# Patient Record
Sex: Male | Born: 1960 | Race: Black or African American | Hispanic: No | Marital: Single | Smoking: Never smoker
Health system: Southern US, Community
[De-identification: ages and names within clinical notes are randomized; demographics above are authoritative.]

## PROBLEM LIST (undated history)

## (undated) DIAGNOSIS — G4700 Insomnia, unspecified: Secondary | ICD-10-CM

## (undated) DIAGNOSIS — N529 Male erectile dysfunction, unspecified: Secondary | ICD-10-CM

## (undated) DIAGNOSIS — J302 Other seasonal allergic rhinitis: Secondary | ICD-10-CM

## (undated) DIAGNOSIS — M179 Osteoarthritis of knee, unspecified: Secondary | ICD-10-CM

## (undated) DIAGNOSIS — M1710 Unilateral primary osteoarthritis, unspecified knee: Secondary | ICD-10-CM

## (undated) DIAGNOSIS — M1990 Unspecified osteoarthritis, unspecified site: Secondary | ICD-10-CM

## (undated) HISTORY — DX: Unilateral primary osteoarthritis, unspecified knee: M17.10

## (undated) HISTORY — DX: Other seasonal allergic rhinitis: J30.2

## (undated) HISTORY — PX: SHOULDER SURGERY: SHX246

## (undated) HISTORY — DX: Unspecified osteoarthritis, unspecified site: M19.90

## (undated) HISTORY — DX: Insomnia, unspecified: G47.00

## (undated) HISTORY — DX: Male erectile dysfunction, unspecified: N52.9

## (undated) HISTORY — DX: Osteoarthritis of knee, unspecified: M17.9

## (undated) HISTORY — PX: ROTATOR CUFF REPAIR: SHX139

## (undated) HISTORY — PX: KNEE SURGERY: SHX244

## (undated) HISTORY — PX: UMBILICAL HERNIA REPAIR: SHX196

---

## 1999-05-06 ENCOUNTER — Inpatient Hospital Stay (HOSPITAL_COMMUNITY): Admission: AD | Admit: 1999-05-06 | Discharge: 1999-05-10 | Payer: Self-pay | Admitting: Internal Medicine

## 1999-05-07 ENCOUNTER — Encounter: Payer: Self-pay | Admitting: Internal Medicine

## 1999-07-14 ENCOUNTER — Ambulatory Visit (HOSPITAL_COMMUNITY): Admission: RE | Admit: 1999-07-14 | Discharge: 1999-07-14 | Payer: Self-pay | Admitting: Family Medicine

## 2001-02-25 ENCOUNTER — Encounter: Admission: RE | Admit: 2001-02-25 | Discharge: 2001-02-25 | Payer: Self-pay | Admitting: Family Medicine

## 2001-02-25 ENCOUNTER — Encounter: Payer: Self-pay | Admitting: Family Medicine

## 2001-04-16 ENCOUNTER — Ambulatory Visit (HOSPITAL_BASED_OUTPATIENT_CLINIC_OR_DEPARTMENT_OTHER): Admission: RE | Admit: 2001-04-16 | Discharge: 2001-04-16 | Payer: Self-pay | Admitting: Orthopedic Surgery

## 2002-06-04 ENCOUNTER — Encounter: Admission: RE | Admit: 2002-06-04 | Discharge: 2002-06-04 | Payer: Self-pay | Admitting: Orthopaedic Surgery

## 2002-06-04 ENCOUNTER — Encounter: Payer: Self-pay | Admitting: Orthopaedic Surgery

## 2002-06-18 ENCOUNTER — Ambulatory Visit (HOSPITAL_BASED_OUTPATIENT_CLINIC_OR_DEPARTMENT_OTHER): Admission: RE | Admit: 2002-06-18 | Discharge: 2002-06-18 | Payer: Self-pay | Admitting: Orthopaedic Surgery

## 2002-11-27 ENCOUNTER — Encounter: Payer: Self-pay | Admitting: Family Medicine

## 2002-11-27 ENCOUNTER — Encounter: Admission: RE | Admit: 2002-11-27 | Discharge: 2002-11-27 | Payer: Self-pay | Admitting: Family Medicine

## 2003-01-07 ENCOUNTER — Encounter: Payer: Self-pay | Admitting: Family Medicine

## 2003-01-07 ENCOUNTER — Encounter: Admission: RE | Admit: 2003-01-07 | Discharge: 2003-01-07 | Payer: Self-pay | Admitting: Family Medicine

## 2003-03-11 ENCOUNTER — Ambulatory Visit (HOSPITAL_BASED_OUTPATIENT_CLINIC_OR_DEPARTMENT_OTHER): Admission: RE | Admit: 2003-03-11 | Discharge: 2003-03-11 | Payer: Self-pay | Admitting: Orthopaedic Surgery

## 2003-04-24 ENCOUNTER — Encounter: Admission: RE | Admit: 2003-04-24 | Discharge: 2003-04-24 | Payer: Self-pay | Admitting: Family Medicine

## 2003-07-07 ENCOUNTER — Ambulatory Visit (HOSPITAL_COMMUNITY): Admission: RE | Admit: 2003-07-07 | Discharge: 2003-07-07 | Payer: Self-pay | Admitting: Orthopedic Surgery

## 2006-07-20 ENCOUNTER — Ambulatory Visit: Payer: Self-pay | Admitting: Family Medicine

## 2006-07-20 LAB — CONVERTED CEMR LAB
Cholesterol: 139 mg/dL (ref 0–200)
GC Probe Amp, Urine: NEGATIVE
Glucose, Bld: 106 mg/dL — ABNORMAL HIGH (ref 70–99)
HCV Ab: NEGATIVE
Hep B S Ab: NEGATIVE
Total CHOL/HDL Ratio: 4.3
Triglycerides: 125 mg/dL (ref 0–149)
VLDL: 25 mg/dL (ref 0–40)

## 2006-08-07 ENCOUNTER — Ambulatory Visit (HOSPITAL_COMMUNITY): Admission: RE | Admit: 2006-08-07 | Discharge: 2006-08-07 | Payer: Self-pay | Admitting: Orthopedic Surgery

## 2007-04-20 ENCOUNTER — Telehealth (INDEPENDENT_AMBULATORY_CARE_PROVIDER_SITE_OTHER): Payer: Self-pay | Admitting: *Deleted

## 2007-06-22 ENCOUNTER — Ambulatory Visit: Payer: Self-pay | Admitting: Family Medicine

## 2007-06-22 DIAGNOSIS — B009 Herpesviral infection, unspecified: Secondary | ICD-10-CM

## 2007-06-22 DIAGNOSIS — J019 Acute sinusitis, unspecified: Secondary | ICD-10-CM

## 2007-06-25 ENCOUNTER — Telehealth (INDEPENDENT_AMBULATORY_CARE_PROVIDER_SITE_OTHER): Payer: Self-pay | Admitting: Family Medicine

## 2007-10-15 ENCOUNTER — Ambulatory Visit: Payer: Self-pay | Admitting: Internal Medicine

## 2007-10-15 DIAGNOSIS — J069 Acute upper respiratory infection, unspecified: Secondary | ICD-10-CM | POA: Insufficient documentation

## 2007-10-16 ENCOUNTER — Ambulatory Visit: Payer: Self-pay | Admitting: Internal Medicine

## 2007-10-16 ENCOUNTER — Telehealth: Payer: Self-pay | Admitting: Internal Medicine

## 2007-10-16 DIAGNOSIS — R042 Hemoptysis: Secondary | ICD-10-CM | POA: Insufficient documentation

## 2007-10-18 ENCOUNTER — Encounter (INDEPENDENT_AMBULATORY_CARE_PROVIDER_SITE_OTHER): Payer: Self-pay | Admitting: *Deleted

## 2007-10-23 ENCOUNTER — Telehealth (INDEPENDENT_AMBULATORY_CARE_PROVIDER_SITE_OTHER): Payer: Self-pay | Admitting: *Deleted

## 2008-01-15 ENCOUNTER — Ambulatory Visit: Payer: Self-pay | Admitting: Internal Medicine

## 2008-01-15 LAB — CONVERTED CEMR LAB
ALT: 16 units/L (ref 0–53)
AST: 16 units/L (ref 0–37)
Albumin: 3.3 g/dL — ABNORMAL LOW (ref 3.5–5.2)
BUN: 13 mg/dL (ref 6–23)
Basophils Relative: 0.4 % (ref 0.0–3.0)
CO2: 32 meq/L (ref 19–32)
Chloride: 105 meq/L (ref 96–112)
Cholesterol: 147 mg/dL (ref 0–200)
Creatinine, Ser: 1.2 mg/dL (ref 0.4–1.5)
Eosinophils Absolute: 0.2 10*3/uL (ref 0.0–0.7)
Eosinophils Relative: 1.8 % (ref 0.0–5.0)
GFR calc Af Amer: 83 mL/min
Glucose, Bld: 87 mg/dL (ref 70–99)
LDL Cholesterol: 93 mg/dL (ref 0–99)
Lymphocytes Relative: 28.2 % (ref 12.0–46.0)
MCHC: 34 g/dL (ref 30.0–36.0)
Monocytes Relative: 10.9 % (ref 3.0–12.0)
Neutrophils Relative %: 58.7 % (ref 43.0–77.0)
PSA: 0.52 ng/mL (ref 0.10–4.00)
Platelets: 226 10*3/uL (ref 150–400)
Potassium: 4.3 meq/L (ref 3.5–5.1)
RDW: 13.9 % (ref 11.5–14.6)
TSH: 1.08 microintl units/mL (ref 0.35–5.50)
Total Bilirubin: 0.6 mg/dL (ref 0.3–1.2)
Total CHOL/HDL Ratio: 5.1
Total Protein, Urine: NEGATIVE mg/dL
Total Protein: 6.9 g/dL (ref 6.0–8.3)
Triglycerides: 125 mg/dL (ref 0–149)
Urine Glucose: NEGATIVE mg/dL
Urobilinogen, UA: 1 (ref 0.0–1.0)
pH: 6.5 (ref 5.0–8.0)

## 2008-01-18 ENCOUNTER — Ambulatory Visit: Payer: Self-pay | Admitting: Internal Medicine

## 2008-01-18 DIAGNOSIS — M1710 Unilateral primary osteoarthritis, unspecified knee: Secondary | ICD-10-CM

## 2008-01-22 ENCOUNTER — Telehealth (INDEPENDENT_AMBULATORY_CARE_PROVIDER_SITE_OTHER): Payer: Self-pay | Admitting: *Deleted

## 2008-05-12 ENCOUNTER — Ambulatory Visit: Payer: Self-pay | Admitting: Internal Medicine

## 2008-05-12 ENCOUNTER — Ambulatory Visit: Payer: Self-pay | Admitting: Cardiology

## 2008-05-12 DIAGNOSIS — F528 Other sexual dysfunction not due to a substance or known physiological condition: Secondary | ICD-10-CM

## 2008-05-12 DIAGNOSIS — R109 Unspecified abdominal pain: Secondary | ICD-10-CM

## 2008-05-12 LAB — CONVERTED CEMR LAB
ALT: 22 units/L (ref 0–53)
Alkaline Phosphatase: 48 units/L (ref 39–117)
Bilirubin, Direct: 0.2 mg/dL (ref 0.0–0.3)
CO2: 30 meq/L (ref 19–32)
Calcium: 8.9 mg/dL (ref 8.4–10.5)
Eosinophils Relative: 0.8 % (ref 0.0–5.0)
GFR calc Af Amer: 92 mL/min
Hemoglobin, Urine: NEGATIVE
Lymphocytes Relative: 20.5 % (ref 12.0–46.0)
Monocytes Absolute: 0.7 10*3/uL (ref 0.1–1.0)
Monocytes Relative: 6.7 % (ref 3.0–12.0)
Neutrophils Relative %: 71.5 % (ref 43.0–77.0)
Nitrite: NEGATIVE
Platelets: 211 10*3/uL (ref 150–400)
Potassium: 4.2 meq/L (ref 3.5–5.1)
RBC / HPF: NONE SEEN
RDW: 13.7 % (ref 11.5–14.6)
Sodium: 139 meq/L (ref 135–145)
Specific Gravity, Urine: >=1.03 (ref 1.000–1.03)
Total Bilirubin: 0.7 mg/dL (ref 0.3–1.2)
Total Protein, Urine: NEGATIVE mg/dL
Urine Glucose: NEGATIVE mg/dL
WBC: 10.5 10*3/uL (ref 4.5–10.5)
pH: 5.5 (ref 5.0–8.0)

## 2008-05-13 ENCOUNTER — Encounter: Payer: Self-pay | Admitting: Internal Medicine

## 2008-05-13 ENCOUNTER — Telehealth (INDEPENDENT_AMBULATORY_CARE_PROVIDER_SITE_OTHER): Payer: Self-pay | Admitting: *Deleted

## 2008-07-23 ENCOUNTER — Ambulatory Visit: Payer: Self-pay | Admitting: Internal Medicine

## 2008-07-23 DIAGNOSIS — N453 Epididymo-orchitis: Secondary | ICD-10-CM | POA: Insufficient documentation

## 2008-07-23 DIAGNOSIS — J309 Allergic rhinitis, unspecified: Secondary | ICD-10-CM | POA: Insufficient documentation

## 2008-07-23 DIAGNOSIS — G4710 Hypersomnia, unspecified: Secondary | ICD-10-CM | POA: Insufficient documentation

## 2008-07-24 ENCOUNTER — Encounter (INDEPENDENT_AMBULATORY_CARE_PROVIDER_SITE_OTHER): Payer: Self-pay | Admitting: *Deleted

## 2009-04-27 ENCOUNTER — Encounter: Payer: Self-pay | Admitting: Pulmonary Disease

## 2009-08-04 ENCOUNTER — Ambulatory Visit: Payer: Self-pay | Admitting: Internal Medicine

## 2009-08-04 DIAGNOSIS — R21 Rash and other nonspecific skin eruption: Secondary | ICD-10-CM

## 2009-08-04 LAB — CONVERTED CEMR LAB
ALT: 19 units/L (ref 0–53)
BUN: 11 mg/dL (ref 6–23)
Basophils Absolute: 0 10*3/uL (ref 0.0–0.1)
Bilirubin Urine: NEGATIVE
Chloride: 107 meq/L (ref 96–112)
Cholesterol: 149 mg/dL (ref 0–200)
Eosinophils Absolute: 0.2 10*3/uL (ref 0.0–0.7)
Glucose, Bld: 99 mg/dL (ref 70–99)
HCT: 42.2 % (ref 39.0–52.0)
LDL Cholesterol: 99 mg/dL (ref 0–99)
Lymphs Abs: 1.8 10*3/uL (ref 0.7–4.0)
MCHC: 33.4 g/dL (ref 30.0–36.0)
MCV: 84.7 fL (ref 78.0–100.0)
Monocytes Absolute: 0.9 10*3/uL (ref 0.1–1.0)
Nitrite: NEGATIVE
Platelets: 218 10*3/uL (ref 150.0–400.0)
Potassium: 4.3 meq/L (ref 3.5–5.1)
RDW: 14.7 % — ABNORMAL HIGH (ref 11.5–14.6)
Total Bilirubin: 0.8 mg/dL (ref 0.3–1.2)
Total Protein, Urine: NEGATIVE mg/dL
Triglycerides: 97 mg/dL (ref 0.0–149.0)
VLDL: 19.4 mg/dL (ref 0.0–40.0)
pH: 5.5 (ref 5.0–8.0)

## 2009-09-09 ENCOUNTER — Ambulatory Visit: Payer: Self-pay | Admitting: Internal Medicine

## 2009-09-09 DIAGNOSIS — G4700 Insomnia, unspecified: Secondary | ICD-10-CM

## 2009-09-11 LAB — CONVERTED CEMR LAB

## 2009-10-20 ENCOUNTER — Encounter: Payer: Self-pay | Admitting: Internal Medicine

## 2010-04-29 ENCOUNTER — Telehealth: Payer: Self-pay | Admitting: Endocrinology

## 2010-05-11 NOTE — Assessment & Plan Note (Signed)
Summary: CHEST/SPOT---STC   Vital Signs:  Patient profile:   50 year old male Height:      72 inches Weight:      389 pounds BMI:     52.95 O2 Sat:      96 % on Room air Temp:     98.2 degrees F oral Pulse rate:   71 / minute BP sitting:   134 / 92  (left arm) Cuff size:   large  Vitals Entered ByMarland Kitchen Zella Ball Ewing (August 04, 2009 11:37 AM)  O2 Flow:  Room air  CC: Spots on Chest/RE   CC:  Spots on Chest/RE.  History of Present Illness: works for medical supply company;  wants to try viagra instead the lower dose levitra;  also with skin lesions multiple to the chest , upper back, right leg and left chest;  has some blood with teeth brushing but overall minor per pt and may be more due to gingival disease;  Pt denies CP, sob, doe, wheezing, orthopnea, pnd, worsening LE edema, palps, dizziness or syncope  Pt denies new neuro symptoms such as headache, facial or extremity weakness  Denies polys and OSA symtpoms such as hypersomnolence.   No trauma to skin, no itch and no other spont blood loss such as urine or rectal.    Problems Prior to Update: 1)  Rash-nonvesicular  (ICD-782.1) 2)  Unspecified Orchitis and Epididymitis  (ICD-604.90) 3)  Hypersomnia  (ICD-780.54) 4)  Allergic Rhinitis  (ICD-477.9) 5)  Erectile Dysfunction  (ICD-302.72) 6)  Flank Pain, Right  (ICD-789.09) 7)  Osteoarthritis, Knees, Bilateral  (ICD-715.96) 8)  Sexually Transmitted Disease, Exposure To  (ICD-V01.6) 9)  Preventive Health Care  (ICD-V70.0) 10)  Hemoptysis  (ICD-786.3) 11)  Uri  (ICD-465.9) 12)  Sinusitis- Acute-nos  (ICD-461.9) 13)  Hsv  (ICD-054.9)  Medications Prior to Update: 1)  Levitra 10 Mg  Tabs (Vardenafil Hcl) .... Take One Table As Needed 2)  Acyclovir 200 Mg  Caps (Acyclovir) .... Take One Tablet Daily 3)  Amoxicillin-Pot Clavulanate 875-125 Mg  Tabs (Amoxicillin-Pot Clavulanate) .Marland Kitchen.. 1 Q 12 Hrs With A Meal 4)  Promethazine-Codeine 6.25-10 Mg/79ml  Syrp (Promethazine-Codeine) .Marland Kitchen.. 1 Tsp  Q 6 Hrs As Needed Cough 5)  Prednisone 20 Mg  Tabs (Prednisone) .Marland Kitchen.. 1 Two Times A Day With Food 6)  Acyclovir 200 Mg Caps (Acyclovir) .Marland Kitchen.. 1 By Mouth Once Daily 7)  Hydrocodone-Acetaminophen 5-325 Mg Tabs (Hydrocodone-Acetaminophen) .Marland Kitchen.. 1 - 2 By Mouth Q 6 Hrs As Needed Pain 8)  Fluticasone Propionate 50 Mcg/act Susp (Fluticasone Propionate) .... 2 Spray/side Once Daily As Needed 9)  Cetirizine Hcl 10 Mg Tabs (Cetirizine Hcl) .Marland Kitchen.. 1 By Mouth Once Daily As Needed 10)  Ciprofloxacin Hcl 500 Mg Tabs (Ciprofloxacin Hcl) .Marland Kitchen.. 1 By Mouth Two Times A Day  Current Medications (verified): 1)  Viagra 100 Mg Tabs (Sildenafil Citrate) .Marland Kitchen.. 1 By Mouth Once Daily As Needed 2)  Acyclovir 200 Mg Caps (Acyclovir) .Marland Kitchen.. 1 By Mouth Once Daily  Allergies (verified): No Known Drug Allergies  Past History:  Past Medical History: Last updated: 07/23/2008 knee djd right thumb djd genital herpes E.D. Allergic rhinitis  Past Surgical History: Last updated: 01/18/2008 s/p bilat knee surgury x 5 total s/p right rotater cuff tear  s/p left shoulder tendon surgury s/p umbilical hernia repair as infant  Family History: Last updated: 01/18/2008 mother with arthritis, HTN, heart disease father died ? reason/undetermined at 73 yo several others with heart disease,stroke  Social History: Last updated: 01/18/2008 Divorced  2 children work Radiographer, therapeutic - [employer] Never Smoked Alcohol use-yes  Risk Factors: Smoking Status: never (01/18/2008)  Review of Systems  The patient denies anorexia, fever, weight loss, vision loss, decreased hearing, hoarseness, chest pain, syncope, peripheral edema, prolonged cough, headaches, hemoptysis, abdominal pain, melena, hematochezia, severe indigestion/heartburn, hematuria, muscle weakness, suspicious skin lesions, transient blindness, difficulty walking, depression, unusual weight change, abnormal bleeding, enlarged lymph nodes, and angioedema.         all  otherwise negative per pt -    Physical Exam  General:  alert and overweight-appearing.   Head:  normocephalic and atraumatic.   Eyes:  vision grossly intact, pupils equal, and pupils round.   Ears:  R ear normal and L ear normal.   Nose:  no external deformity and no nasal discharge.   Mouth:  no gingival abnormalities and pharynx pink and moist.   Neck:  supple and no masses.   Lungs:  normal respiratory effort and normal breath sounds.   Heart:  normal rate and regular rhythm.   Abdomen:  soft, non-tender, and normal bowel sounds.   Msk:  no joint tenderness and no joint swelling.   Extremities:  trace edema, no erythema  Neurologic:  cranial nerves II-XII intact and strength normal in all extremities.   Skin:  has plaque-like skin lesions up to 3 cm without tenderness - left upper chest, right upper back adn mid upper back;  right lateral thigh and left lat chest Psych:  not anxious appearing and not depressed appearing.     Impression & Recommendations:  Problem # 1:  Preventive Health Care (ICD-V70.0)  Overall doing well, age appropriate education and counseling updated and referral for appropriate preventive services done unless declined, immunizations up to date or declined, diet counseling done if overweight, urged to quit smoking if smokes , most recent labs reviewed and current ordered if appropriate, ecg reviewed or declined (interpretation per ECG scanned in the EMR if done); information regarding Medicare Prevention requirements given if appropriate   Orders: TLB-BMP (Basic Metabolic Panel-BMET) (80048-METABOL) TLB-CBC Platelet - w/Differential (85025-CBCD) TLB-Hepatic/Liver Function Pnl (80076-HEPATIC) TLB-Lipid Panel (80061-LIPID) TLB-PSA (Prostate Specific Antigen) (84153-PSA) TLB-TSH (Thyroid Stimulating Hormone) (84443-TSH) TLB-Udip ONLY (81003-UDIP)  Problem # 2:  ERECTILE DYSFUNCTION (ICD-302.72)  His updated medication list for this problem includes:     Viagra 100 Mg Tabs (Sildenafil citrate) .Marland Kitchen... 1 by mouth once daily as needed treat as above, f/u any worsening signs or symptoms   Problem # 3:  RASH-NONVESICULAR (ICD-782.1) unclear etiology - appearance somewhat bruise like but nontener and persists for 3 mo - to refer to derm  Orders: Dermatology Referral (Derma)  Complete Medication List: 1)  Viagra 100 Mg Tabs (Sildenafil citrate) .Marland Kitchen.. 1 by mouth once daily as needed 2)  Acyclovir 200 Mg Caps (Acyclovir) .Marland Kitchen.. 1 by mouth once daily  Patient Instructions: 1)  Please take all new medications as prescribed 2)  Continue all previous medications as before this visit  3)  Please go to the Lab in the basement for your blood and/or urine tests today 4)  You will be contacted about the referral(s) to: Dermatology 5)  Please schedule a follow-up appointment in 1 year or sooner if needed Prescriptions: VIAGRA 100 MG TABS (SILDENAFIL CITRATE) 1 by mouth once daily as needed  #5 x 11   Entered and Authorized by:   Corwin Levins MD   Signed by:   Corwin Levins MD on 08/04/2009   Method used:  Print then Give to Patient   RxID:   [id number]

## 2010-05-11 NOTE — Assessment & Plan Note (Signed)
Summary: PHYSICAL  -STC   Vital Signs:  Patient profile:   50 year old male Height:      72 inches Weight:      396.50 pounds BMI:     53.97 O2 Sat:      98 % on Room air Temp:     97.5 degrees F oral Pulse rate:   78 / minute BP sitting:   144 / 90  (left arm) Cuff size:   large  Vitals Entered ByZella Ball Ewing (September 09, 2009 9:42 AM)  O2 Flow:  Room air CC: Adult Physical/RE   CC:  Adult Physical/RE.  History of Present Illness: here to f/u; still with significant problem wtih getting to sleep most nights but denies worsening depressive symtpoms  or panic.  Gained several bls recetnly, wtih increased daytime somnolence as well, snoring at night, non-restorative sleep.  BP mild elev today - no hx of essential HTN.  Has not been able to be seen per pulm yet.   also requests STD check today, has known hx of genital herpes.  Pt denies CP, sob, doe, wheezing, orthopnea, pnd, worsening LE edema, palps, dizziness or syncope   Pt denies new neuro symptoms such as headache, facial or extremity weakness   Preventive Screening-Counseling & Management      Drug Use:  no.    Problems Prior to Update: 1)  Elevated Blood Pressure Without Diagnosis of Hypertension  (ICD-796.2) 2)  Insomnia-sleep Disorder-unspec  (ICD-780.52) 3)  Sexually Transmitted Disease, Exposure To  (ICD-V01.6) 4)  Rash-nonvesicular  (ICD-782.1) 5)  Unspecified Orchitis and Epididymitis  (ICD-604.90) 6)  Hypersomnia  (ICD-780.54) 7)  Allergic Rhinitis  (ICD-477.9) 8)  Erectile Dysfunction  (ICD-302.72) 9)  Flank Pain, Right  (ICD-789.09) 10)  Osteoarthritis, Knees, Bilateral  (ICD-715.96) 11)  Sexually Transmitted Disease, Exposure To  (ICD-V01.6) 12)  Preventive Health Care  (ICD-V70.0) 13)  Hemoptysis  (ICD-786.3) 14)  Uri  (ICD-465.9) 15)  Sinusitis- Acute-nos  (ICD-461.9) 16)  Hsv  (ICD-054.9)  Medications Prior to Update: 1)  Viagra 100 Mg Tabs (Sildenafil Citrate) .Marland Kitchen.. 1 By Mouth Once Daily As Needed 2)   Acyclovir 200 Mg Caps (Acyclovir) .Marland Kitchen.. 1 By Mouth Once Daily  Current Medications (verified): 1)  Viagra 100 Mg Tabs (Sildenafil Citrate) .Marland Kitchen.. 1 By Mouth Once Daily As Needed 2)  Acyclovir 200 Mg Caps (Acyclovir) .Marland Kitchen.. 1 By Mouth Once Daily 3)  Zolpidem Tartrate 10 Mg Tabs (Zolpidem Tartrate) .Marland Kitchen.. 1po At Bedtime As Needed  Allergies (verified): No Known Drug Allergies  Past History:  Past Medical History: Last updated: 07/23/2008 knee djd right thumb djd genital herpes E.D. 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   Head:  normocephalic and atraumatic.   Eyes:  vision grossly intact, pupils equal, and pupils round.   Ears:  R ear normal and  L ear normal.   Nose:  no external deformity and no nasal discharge.   Mouth:  no gingival abnormalities and pharynx pink and moist.   Neck:  supple and no masses.   Lungs:  normal respiratory effort and normal breath sounds.   Heart:  normal rate and regular rhythm.   Abdomen:  soft, non-tender, and normal bowel sounds.   Genitalia:  deferred Msk:  no joint warmth.   Extremities:  no edema, no erythema    Impression & Recommendations:  Problem # 1:  HYPERSOMNIA (ICD-780.54) refer to pulmonary Orders: EKG w/ Interpretation (93000) Pulmonary Referral  (Pulmonary)  Problem # 2:  INSOMNIA-SLEEP DISORDER-UNSPEC (ICD-780.52)  His updated medication list for this problem includes:    Zolpidem Tartrate 10 Mg Tabs (Zolpidem tartrate) .Marland Kitchen... 1po at bedtime as needed treat as above, f/u any worsening signs or symptoms   Problem # 3:  SEXUALLY TRANSMITTED DISEASE, EXPOSURE TO (ICD-V01.6)  to check STD related testing today per pt request; has known hx of genital herpes  Orders: T-HIV-1 (Screen) (16109) T-Syphilis Test ([facility]) [phone]) T-Chlamydia & GC Probe, Urine (87491/[id number])  Problem # 4:  ELEVATED BLOOD PRESSURE WITHOUT DIAGNOSIS OF HYPERTENSION (ICD-796.2) to follow for now, check BP at home  Complete Medication List: 1)  Viagra 100 Mg Tabs (Sildenafil citrate) .Marland Kitchen.. 1 by mouth once daily as needed 2)  Acyclovir 200 Mg Caps (Acyclovir) .Marland Kitchen.. 1 by mouth once daily 3)  Zolpidem Tartrate 10 Mg Tabs (Zolpidem tartrate) .Marland Kitchen.. 1po at bedtime as needed  Patient Instructions: 1)  Please take all new medications as prescribed 2)  Continue all previous medications as before this visit  3)  Please go to the Lab in the basement for your blood and/or urine tests today 4)  You will be contacted about the referral(s) to: Pulmonary 5)  Please see the dermatologist as planned 6)  Check your Blood Pressure regularly. If it is above 140/90: you should make an appointment. 7)  Please schedule a follow-up appointment in 1 year, or sooner if needed Prescriptions: ZOLPIDEM TARTRATE 10 MG TABS (ZOLPIDEM TARTRATE) 1po at bedtime as needed  #30 x 5   Entered and Authorized by:   Corwin Levins MD   Signed by:   Corwin Levins MD on 09/09/2009   Method used:   Print then Give to Patient   RxID:   [id number]

## 2010-05-11 NOTE — Miscellaneous (Signed)
Summary: Orders Update  Clinical Lists Changes  Orders: Added new Service order of No Show NS50 (NS50) - Signed 

## 2010-05-11 NOTE — Consult Note (Signed)
Summary: Dermatology Specialist  Dermatology Specialist   Imported By: Lennie Odor 10/22/2009 09:03:44  _____________________________________________________________________  External Attachment:    Type:   Image     Comment:   External Document

## 2010-05-13 ENCOUNTER — Ambulatory Visit (INDEPENDENT_AMBULATORY_CARE_PROVIDER_SITE_OTHER): Payer: BC Managed Care – PPO | Admitting: Internal Medicine

## 2010-05-13 ENCOUNTER — Encounter: Payer: Self-pay | Admitting: Internal Medicine

## 2010-05-13 DIAGNOSIS — J209 Acute bronchitis, unspecified: Secondary | ICD-10-CM

## 2010-05-13 DIAGNOSIS — M549 Dorsalgia, unspecified: Secondary | ICD-10-CM

## 2010-05-13 DIAGNOSIS — J309 Allergic rhinitis, unspecified: Secondary | ICD-10-CM

## 2010-05-13 DIAGNOSIS — G4710 Hypersomnia, unspecified: Secondary | ICD-10-CM

## 2010-05-13 NOTE — Progress Notes (Signed)
Summary: Ambien/JWJ pt  Phone Note Refill Request Message from:  Patient on April 29, 2010 4:20 PM  Refills Requested: Medication #1:  ZOLPIDEM TARTRATE 10 MG TABS 1po at bedtime as needed.   Dosage confirmed as above?Dosage Confirmed   Supply Requested: 3 months  Method Requested: Electronic Initial call taken by: Margaret Pyle, CMA,  April 29, 2010 4:20 PM  Follow-up for Phone Call        i printed Follow-up by: Minus Breeding MD,  May 02, 2010 4:16 PM  Additional Follow-up for Phone Call Additional follow up Details #1::        Rx telephoned to pharmacy as [facility]E is unavailable to [facility] Rx Additional Follow-up by: Margaret Pyle, CMA,  May 03, 2010 9:21 AM    Prescriptions: ZOLPIDEM TARTRATE 10 MG TABS (ZOLPIDEM TARTRATE) 1po at bedtime as needed  #30 x 0   Entered and Authorized by:   Margaret Pyle, CMA   Signed by:   Margaret Pyle, CMA on 05/03/2010   Method used:   Telephoned to ...       [facility] [address] [phone]* (retail)       [address]       [place], [place]  [address]       Ph: [phone]       Fax: [phone]   RxID:   [id number] ZOLPIDEM TARTRATE 10 MG TABS (ZOLPIDEM TARTRATE) 1po at bedtime as needed  #30 x 0   Entered and Authorized by:   Minus Breeding MD   Signed by:   Minus Breeding MD on 05/02/2010   Method used:   Print then Give to Patient   RxID:   [id number]

## 2010-05-19 NOTE — Assessment & Plan Note (Signed)
Summary: chest congestion   Vital Signs:  Patient profile:   50 year old male Height:      71 inches Weight:      339.25 pounds BMI:     47.49 O2 Sat:      97 % on Room air Temp:     99.2 degrees F oral Pulse rate:   76 / minute BP sitting:   122 / 78  (left arm) Cuff size:   small  Vitals Entered By: Zella Ball Ewing CMA (AAMA) (May 13, 2010 9:26 AM)  O2 Flow:  Room air CC: Chest Congestion/RE   CC:  Chest Congestion/RE.  History of Present Illness: here to f/u  - overall doing ok , and has been able to dramatically lose wt intentionally from 396 to current 339 with better diet since june 2011;  trying to be more active as well, but still with significant fatigue and daytime sleepiness , with AM headaches  and snoring at night despite the wt loss.  Also with 2-3 days onset fever, headache, ST and now prod cough with greenish sputum overall mild to mod.,  but Pt denies CP, worsening sob, doe, wheezing, orthopnea, pnd, worsening LE edema, palps, dizziness or syncope  Pt denies new neuro symptoms such as headache, facial or extremity weakness  Pt denies polydipsia, polyuria.  Does also have some right lower back pain with cough that aches for several hours in the past few days at a time, but no other back or side or chest pain,  no bowel or bladder change, no gait change, fall and no LE pain/numb/weak/   Has ongoing nasal allergy symptoms but usually in the spring only for the most part and have not yet started this yr.    Problems Prior to Update: 1)  Bronchitis-acute  (ICD-466.0) 2)  Insomnia-sleep Disorder-unspec  (ICD-780.52) 3)  Sexually Transmitted Disease, Exposure To  (ICD-V01.6) 4)  Rash-nonvesicular  (ICD-782.1) 5)  Unspecified Orchitis and Epididymitis  (ICD-604.90) 6)  Hypersomnia  (ICD-780.54) 7)  Allergic Rhinitis  (ICD-477.9) 8)  Erectile Dysfunction  (ICD-302.72) 9)  Flank Pain, Right  (ICD-789.09) 10)  Osteoarthritis, Knees, Bilateral  (ICD-715.96) 11)  Sexually  Transmitted Disease, Exposure To  (ICD-V01.6) 12)  Preventive Health Care  (ICD-V70.0) 13)  Hemoptysis  (ICD-786.3) 14)  Uri  (ICD-465.9) 15)  Sinusitis- Acute-nos  (ICD-461.9) 16)  Hsv  (ICD-054.9)  Medications Prior to Update: 1)  Viagra 100 Mg Tabs (Sildenafil Citrate) .Marland Kitchen.. 1 By Mouth Once Daily As Needed 2)  Acyclovir 200 Mg Caps (Acyclovir) .Marland Kitchen.. 1 By Mouth Once Daily 3)  Zolpidem Tartrate 10 Mg Tabs (Zolpidem Tartrate) .Marland Kitchen.. 1po At Bedtime As Needed  Current Medications (verified): 1)  Viagra 100 Mg Tabs (Sildenafil Citrate) .Marland Kitchen.. 1 By Mouth Once Daily As Needed 2)  Acyclovir 200 Mg Caps (Acyclovir) .Marland Kitchen.. 1 By Mouth Once Daily 3)  Zolpidem Tartrate 10 Mg Tabs (Zolpidem Tartrate) .Marland Kitchen.. 1po At Bedtime As Needed 4)  Cephalexin 500 Mg Caps (Cephalexin) .Marland Kitchen.. 1po Three Times A Day 5)  Tussionex Pennkinetic Er 10-8 Mg/73ml Lqcr (Hydrocod Polst-Chlorphen Polst) .Marland Kitchen.. 1 By Mouth Two Times A Day As Needed  Allergies (verified): No Known Drug Allergies  Past History:  Past Medical History: Last updated: 07/23/2008 knee djd right thumb djd genital herpes E.D. 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   Eyes:  vision grossly intact, pupils equal, and pupils round.   Ears:  bilat tm's mild erythema, sinus nontender Nose:  nasal dischargemucosal pallor and mucosal edema.   Mouth:  pharyngeal erythema and fair dentition.   Neck:  supple and cervical lymphadenopathy.    Lungs:  normal respiratory effort and normal breath sounds, no wheezes or rales.   Heart:  normal rate and regular rhythm.   Msk:  no joint tenderness and no joint swelling.  and no spine or paraspinal tender or swelling or rash Extremities:  no edema, no erythema  Neurologic:  strength normal in all extremities and gait normal.     Impression & Recommendations:  Problem # 1:  BRONCHITIS-ACUTE (ICD-466.0)  His updated medication list for this problem includes:    Cephalexin 500 Mg Caps (Cephalexin) .Marland Kitchen... 1po three times a day    Tussionex Pennkinetic Er 10-8 Mg/64ml Lqcr (Hydrocod polst-chlorphen polst) .Marland Kitchen... 1 by mouth two times a day as needed treat as above, f/u any worsening signs or symptoms   Problem # 2:  HYPERSOMNIA (ICD-780.54) symptoms persist despite recent wt loss , encouragae further wt loss, refer pulm for further eval and tx Orders: Pulmonary Referral (Pulmonary)  Problem # 3:  BACK PAIN (ICD-724.5) c/w MSK strain most likely related to cough  - exam benign, ok to follow with expectant management for further symtpoms and signs, and for tylenol as needed   Problem # 4:  ALLERGIC RHINITIS (ICD-477.9) only seasonal it seems - stable overall by hx and exam, ok to continue meds/tx as is - for allegra as needed   Complete Medication List: 1)  Viagra 100 Mg Tabs (Sildenafil citrate) .Marland Kitchen.. 1 by mouth once daily as needed 2)  Acyclovir 200 Mg Caps (Acyclovir) .Marland Kitchen.. 1 by mouth once daily 3)  Zolpidem Tartrate 10 Mg Tabs (Zolpidem tartrate) .Marland Kitchen.. 1po at bedtime as needed 4)  Cephalexin 500 Mg Caps (Cephalexin) .Marland Kitchen.. 1po three times a day 5)  Tussionex Pennkinetic Er 10-8 Mg/71ml Lqcr (Hydrocod polst-chlorphen polst) .Marland Kitchen.. 1 by mouth two times a day as needed  Patient Instructions: 1)  Please take all new medications as prescribed 2)  Continue all previous medications as before this visit  3)  You will be contacted about the referral(s) to: pulmonary 4)  Please schedule a  follow-up appointment in June 2012 for CPX and labs Prescriptions: Sandria Senter ER 10-8 MG/5ML LQCR (HYDROCOD POLST-CHLORPHEN POLST) 1 by mouth two times a day as needed  #6oz x 1   Entered and Authorized by:   Corwin Levins MD   Signed by:   Corwin Levins MD on 05/13/2010   Method used:   Print then Give to Patient   RxID:   [id number] CEPHALEXIN 500 MG CAPS (CEPHALEXIN) 1po three times a day  #30 x 0   Entered and Authorized by:   Corwin Levins MD   Signed by:   Corwin Levins MD on 05/13/2010   Method used:   Print then Give to Patient   RxID:   [id number]    Orders Added: 1)  Pulmonary Referral [Pulmonary] 2)  Est. Patient Level IV [id number]

## 2010-06-01 ENCOUNTER — Institutional Professional Consult (permissible substitution): Payer: BC Managed Care – PPO | Admitting: Pulmonary Disease

## 2010-07-05 ENCOUNTER — Encounter: Payer: Self-pay | Admitting: Pulmonary Disease

## 2010-07-06 ENCOUNTER — Encounter: Payer: Self-pay | Admitting: Pulmonary Disease

## 2010-07-06 ENCOUNTER — Ambulatory Visit (INDEPENDENT_AMBULATORY_CARE_PROVIDER_SITE_OTHER): Payer: BC Managed Care – PPO | Admitting: Pulmonary Disease

## 2010-07-06 VITALS — BP 120/60 | HR 80 | Temp 98.6°F | Ht 72.0 in | Wt 332.6 lb

## 2010-07-06 DIAGNOSIS — G4710 Hypersomnia, unspecified: Secondary | ICD-10-CM

## 2010-07-06 NOTE — Patient Instructions (Signed)
Schedule sleep study Continue your weight loss program

## 2010-07-06 NOTE — Assessment & Plan Note (Signed)
Given excessive daytime somnolence, narrow pharyngeal exam, witnessed apneas & loud snoring, obstructive sleep apnea is very likely & an overnight polysomnogram will be scheduled as a split study. The pathophysiology of obstructive sleep apnea , it's cardiovascular consequences & modes of treatment including CPAP were discused with the patient in detail & they evidenced understanding.  Weight loss encouraged - he has done well by losing 60 lbs!

## 2010-07-06 NOTE — Progress Notes (Signed)
  Subjective:    Patient ID: Tyler Larson, male    DOB: January 17, 1961, 50 y.o.   MRN: [id number]  HPI 49/M, obese, referred for evaluation of obstructive sleep apnea  Lost 65 lb last 6 months   Witnessed apneas by bed partner, loud snoring ambien caused grogginess Works second shift, TV on , latency 109m- 2 h, sleeps on his side with 3 pillows, 2-3 awakenings, nocturia +, alarm set  at 5a for workout , gets back to bed x 2-3 hr after, workout 3 /week No scheduled naps on weekends ESS 20/ 24     Review of Systems  Constitutional: Negative for fever, appetite change and unexpected weight change.  HENT: Negative for ear pain, congestion, sore throat, rhinorrhea, sneezing, trouble swallowing, dental problem and postnasal drip.   Eyes: Negative for redness.  Respiratory: Negative for cough, shortness of breath and wheezing.   Cardiovascular: Negative for chest pain, palpitations and leg swelling.  Gastrointestinal: Negative for nausea, vomiting, abdominal pain and diarrhea.  Genitourinary: Negative for dysuria and urgency.  Musculoskeletal: Negative for joint swelling.  Skin: Negative for rash.  Neurological: Negative for syncope and headaches.  Hematological: Does not bruise/bleed easily.  Psychiatric/Behavioral: Negative for dysphoric mood. The patient is not nervous/anxious.        Objective:   Physical Exam    Gen. Pleasant, obese, in no distress, normal affect ENT - no lesions, no post nasal drip, class 3 airway Neck: No JVD, no thyromegaly, no carotid bruits Lungs: no use of accessory muscles, no dullness to percussion, clear without rales or rhonchi  Cardiovascular: Rhythm regular, heart sounds  normal, no murmurs or gallops, no peripheral edema Abdomen: soft and non-tender, no hepatosplenomegaly, BS normal. Musculoskeletal: No deformities, no cyanosis or clubbing Neuro:  alert, non focal    Assessment & Plan:

## 2010-08-16 ENCOUNTER — Encounter (HOSPITAL_BASED_OUTPATIENT_CLINIC_OR_DEPARTMENT_OTHER): Payer: BC Managed Care – PPO

## 2010-08-26 ENCOUNTER — Encounter: Payer: BC Managed Care – PPO | Admitting: Internal Medicine

## 2010-08-27 NOTE — Op Note (Signed)
NAMEKEYLIN, Tyler Larson                             ACCOUNT NO.:  [id number]   MEDICAL RECORD NO.:  [id number]                   PATIENT TYPE:  OIB   LOCATION:  2899                                 FACILITY:  [facility]   PHYSICIAN:  Feliberto Gottron. Turner Daniels, M.D.                DATE OF BIRTH:  1960-11-17   DATE OF PROCEDURE:  07/07/2003  DATE OF DISCHARGE:                                 OPERATIVE REPORT   PREOPERATIVE DIAGNOSES:  Left shoulder impingement, possible rotator cuff  tear.   POSTOPERATIVE DIAGNOSES:  Left shoulder impingement, possible rotator cuff  tear.  Acromioclavicular joint arthritis with subclavicular spur and  degenerative tearing of the superior labrum.   OPERATION PERFORMED:  Left shoulder arthroscopic anterior inferior  acromioplasty, distal clavicle spur excision, debridement of superior labral  tear.   SURGEON:  Feliberto Gottron. Turner Daniels, M.D.   ASSISTANTLaural Benes. Jannet Mantis.   ANESTHESIA:  Interscalene block plus general endotracheal.   ESTIMATED BLOOD LOSS:  Minimal.   FLUIDS REPLACED:  1L of crystalloid.   DRAINS:  None.   TOURNIQUET TIME:  None.   INDICATIONS FOR PROCEDURE:  The patient is a 50 year old man who had a right  shoulder arthroscopic anterior inferior acromioplasty, arthroscopic rotator  cuff repair on the right side done by me some four to five years ago and is  now having similar pain on the left side where he also has a 1 cm type 2 to  3 subacromial spur.  He has failed conservative treatment and desires  elective arthroscopic evaluation and treatment of the shoulder.  Because of  the problems he had on the right side, we did not do a preoperative MRI  scan.  At a minimum he will get the acromioplasty, removal of distal  clavicle spur and then debridement or repair of any soft tissue injuries.   DESCRIPTION OF PROCEDURE:  The patient was identified by arm band and taken  to the operating room at [facility] main hospital where appropriate  anesthetic  monitors are attached and interscalene block anesthesia induced  in the left upper extremity followed by general endotracheal anesthesia.  He  was then placed in the beach chair position.  Left upper extremity was  prepped and draped in the usual sterile fashion from the wrist to the  hemithorax.  Using a #11 blade, standard portals were then made 1.5 cm  anterior to the acromioclavicular joint lateral to the junction of the  middle and posterior thirds of the acromion and posterior to the  posterolateral corner of the acromion process.  The inflow was placed  anteriorly, the arthroscope laterally and a 4.2 Great White sucker shaver  posteriorly allowing evaluation of the external surface of the rotator cuff  and the subacromial bursectomy.  No significant tearing was noted to the  external leaflets of the rotator cuff.  The spur was then outlined with  the  4.2 Great White sucker shaver as was the distal clavicle. We then performed  a standard resection with a 4.5 hooded Vortex bur completely decompressing  the shoulder.  Satisfied with the decompression, small bleeders were  identified and cauterized with the underwater electrocautery.  The  arthroscope was repositioned into the glenohumeral joint using the posterior  portal for diagnostic arthroscopy revealed some degenerative tearing of the  superior labrum which was debrided and grade 2 to 3 chondromalacia of the  humeral head and glenoid which was also lightly debrided.  At this point the  shoulder was then washed out with normal saline solution.  The arthroscopic  instruments were removed.  A dressing of Xeroform, 4 x 4 dressing sponges  and paper tape applied followed by a sling.  Patient awakened and taken to  the recovery room without difficulty.                                               Feliberto Gottron. Turner Daniels, M.D.    Ovid Curd  D:  07/07/2003  T:  07/07/2003  Job:  [id number]

## 2010-08-27 NOTE — Discharge Summary (Signed)
[place]  Patient:    Tyler Larson                         MRN: [id number] Adm. Date:  [id number] Disc. Date: [id number] Attending:  Rosanne Sack CC:         Meade Maw, M.D.             Dorena Bodo, M.D. [facility]                           Discharge Summary  DATE OF BIRTH: Sep 18, 1960.  DISCHARGE DIAGNOSES:  1. 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     a. 2D echocardiogram (May 07, 1999) EF 45-50% with anterior septal        hypokinesis.     b. Adenosine Cardiolite consistent with anterior and posterior-basal        scars, EF 55-58%.     c. Cardiac catheterization (May 10, 1999) - normal coronaries, EF        of 60%.     d. TSH 2.184.  2. Fluid overload.  3. Abnormal EKG, T-wave inversion in the inferior lateral leads.     a. Negative cardiac enzymes.  4. Cardiac workup as in problem #1.  5. Recent influence ______ systemic syndrome (March 26, 1999).  6. Recent pharyngitis (April 22, 1999).  7. Obesity.  8. Obesity sleep apnea.     a. Sleep study scheduled for March of 2001.  9. Nocturnal bradycardia - ? related to sleep apnea. 10. Postnasal drip - allergic rhinitis improved. 11. Status post bilateral knee surgery.  DISCHARGE MEDICATIONS: 1. Lotensin HCT 5/6.25 p.o. q.d. 2. Nasonex spray 2 puffs to each nostril b.i.d.  DISCHARGE FOLLOW UP AND DISPOSITION:  Tyler Larson will be followed in [facility] by Dr. Cassandria Santee on Thursday, May 13, 1999, at 1 p.m. During this followup visit Dr. Cassandria Santee is to reassess the patients size of fluid overload.  The patients weight at the time of discharge was 389.1 pounds. Given the results of the cardiac catheterization obtained today eventually the Lotensin HCT could be discontinued in the near future.  In retrospective, viral myocarditis with transient  cardiomyopathy is very likely.  CONSULTATIONS:  Dr. Meade Maw [facility]).  PROCEDURES: 1. 2D echocardiogram (see results above). 2. Adenosine Cardiolite (see results above). 3. Cardiac catheterization (see results above).  LABORATORY DATA: Sodium was 139, potassium 3.9, chloride 102, CO2 30, BUN 16, creatinine 1.0, glucose 106.  Total cholesterol was 159, LDL 107, HDL 28.  TSH as 2.184.  Negative cardiac enzymes x 3.  HISTORY OF PRESENT ILLNESS: Tyler Larson is a 50 year old African-American male admitted to [facility] on May 06, 1999, with congestive heart failure and fluid overload.  (Please see the admission history and physical examination by Dr. Jamie Brookes for further details regarding the history of presentation, physical examination and laboratory data.  HOSPITAL COURSE:  #1 - CONGESTIVE HEART FAILURE SECONDARY TO TRANSIENT VIRAL CARDIOMYOPATHY AND FLUID OVERLOAD:  The patient was placed on telemetry for cardiac monitoring. Intravenous Lasix was started on admission very 12 hours.  The cardiac enzymes and EKG series were monitored.  The EKG obtained in [facility] on the day of admission showed inferior lateral T-wave inversion.  The follow-up EKG on day 2 of the hospital tay  was unchanged.  The cardiac enzymes were negative.  A 2D echocardiogram showed anterior septal wall hypokinesis with an EF of 45%.  Given the patients age and  obesity, coronary artery disease had to be ruled out.  For this reason, Dr. Jamie Brookes performed an adenosine Cardiolite after discussing the case with Dr. Carmin Richmond.  This adenosine Cardiolite showed an ejection fraction in the 55% range.  Also, scars were found in the anterior wall and the posterior basilar wall. For this reason, a formal cardiology consultation was obtained.  Dr. Fraser Din saw the patient in consultation on May 07, 1999.  After discussing the benefits and  potential complications of  the treatment plan, the patient and Dr. Fraser Din decided that the patient should stay in the hospital over the weekend to be cathed on Monday morning.  On May 10, 1999, the patient underwent cardiac catheterization which showed  normal coronaries and an ejection fraction 60%.  In retrospect, a transient episode of myocarditis followed by cardiomyopathy due to a viral infection is very likely. I suspect that the ejection fraction started to improve after the patient as diuresed aggressively.  Also, the addition of agents like ACE inhibitors and diuretic agents may have improved the patients cardiodynamic status.  Dr. Fraser Din suggested that these agents could be stopped eventually if the patients blood pressure were to remain stable.  The patient did not have any complications postoperatively and he was discharged on May 10, 1999.  His medical condition was determined to be improved.  #2 - FLUID OVERLOAD - Please see problem #1.  The patient presented with congestive heart failure and lower extremity edema.  On day #2 the signs of pulmonary edema were basically resolved.  The lower extremity edema showed significant improvement.  The TSH was within normal limits. The cardiac enzymes were negative.  After two days of diuresis, the patient lost 6.5 pounds.  By the time of discharge about 11 pounds were taken out.  We advised the patient about exercise and a low-salt diet.  In the short run, he will remain on a low dose of a combination of a diuretic agent and an ACE inhibitor.  No specific cardiac follow up was recommended by Dr. Fraser Din in light of the normal coronary arteries.  On the day of discharge, no evidence of lower extremity edema was noted on physical examination.  The patient is to go under sleep study to rule out sleep apnea within a month and a half.  We noted brief episodes of bradycardia felt to be associated with sleep apnea.  Otherwise, the patient  remained afebrile and hemodynamically stable.  The patients medical condition was determined improved by the time of discharge. DD:  05/10/99 TD:  05/10/99  Job: [id number]

## 2010-08-27 NOTE — Op Note (Signed)
Tyler Larson, Tyler Larson                             ACCOUNT NO.:  [id number]   MEDICAL RECORD NO.:  [id number]                   PATIENT TYPE:  AMB   LOCATION:  DSC                                  FACILITY:  [facility]   PHYSICIAN:  Lubertha Basque. Jerl Santos, M.D.             DATE OF BIRTH:  1960/09/12   DATE OF PROCEDURE:  03/11/2003  DATE OF DISCHARGE:                                 OPERATIVE REPORT   PREOPERATIVE DIAGNOSES:  1. Right knee torn medial meniscus.  2. Right knee degenerative joint disease.  3. Painful retained hardware.   POSTOPERATIVE DIAGNOSES:  1. Right knee medial and lateral meniscus tears.  2. Right knee tricompartmental degenerative joint disease.  3. Right knee loose body.  4. Buried hardware.   ANESTHESIA:  General.   ATTENDING SURGEON:  Lubertha Basque. Jerl Santos, M.D.   ASSISTANT:  Lindwood Qua, P.A.   INDICATIONS FOR PROCEDURE:  The patient is a 50 year old male with a long  history of right knee difficulties stemming from an injury playing football  in college.  He is status post a successful opposite knee arthroscopy and  continues with pain and swelling on his right knee.  He is offered an  arthroscopy as he has pain at rest and pain with activities.  He also has  pain directly over what appeared to be a prominent staple on the medial  aspect, and we offered him removal of this staple at the same sitting.  Informed operative consent was obtained after discussion of possible  complications of and reaction to anesthesia, infection, and fracture.   DESCRIPTION OF PROCEDURE:  The patient was taken to the operating suite  where general anesthetic was applied without difficulty.  He was positioned  supine and prepped and draped in the normal sterile fashion.  After  administration of preoperative IV antibiotics, an arthroscopy of the right  knee was performed through two inferior portals.  Suprapatellar pouch was  benign while patellofemoral joint included some grade 3  change.  A  chondroplasty was done here.  In the medial compartment, he had grade 3 and  4 change, and a thorough chondroplasty with an abrasion was performed.  He  had a torn medial meniscus, posterior horn, and a 5% partial medial  meniscectomy was required.  In the lateral compartment, there was grade 3  and 4 change mostly on the tibial aspect.  A thorough chondroplasty and a  brief abrasion was done here.  He did a degenerative tear of the posterior  horn of the lateral meniscus requiring a 5% partial lateral meniscectomy.  There was a loose body measuring about a cm in diameter in the lateral  gutter which I removed.  The knee was then thoroughly irrigated, followed by  the placement of the usual agents.  Attention was then turned toward the  prominent hardware.  This was localized under fluoroscopy and a small  portion  of his old incision was utilized with dissection down to the bone.  I could not feel any prominent hardware despite digital palpation directly  over the bone under fluoroscopic guidance.  It was felt not in his best  interest to dig into the bone to remove any buried hardware, so this was  left alone, and the hardware removal was abandoned.  The wound was irrigated  followed by reapproximation fo subcutaneous tissues with 2-0 undyed Vicryl  and the skin with nylon.  Adaptic was placed over his various wounds  followed by dry gauze and loose Ace wrap.  Estimated blood loss and  intraoperative fluids can be obtained from the anesthesia records.   DISPOSITION:  The patient was extubated in the operating room and taken to  the recovery room in stable condition.  Plans were for him to go home today  and follow up in the office in one week.  I will contact him by phone  tonight.                                               Lubertha Basque Jerl Santos, M.D.    PGD/MEDQ  D:  03/11/2003  T:  03/11/2003  Job:  [id number]

## 2010-08-27 NOTE — Op Note (Signed)
[place]  Patient:    Tyler Larson, Tyler Larson Visit Number: [id number] MRN: [id number]          Service Type: DSU Location: [facility] Attending Physician:  Alinda Deem Dictated by:   Alinda Deem, M.D. Proc. Date: 04/16/01 Admit Date:  04/16/2001                             Operative Report  PREOPERATIVE DIAGNOSIS:  Right shoulder impingement syndrome with nonretracted supraspinatus, rotator cuff tear.  POSTOPERATIVE DIAGNOSIS:  Right shoulder impingement syndrome with nonretracted supraspinatus, rotator cuff tear.  OPERATION PERFORMED:  Right shoulder arthroscopic anterior inferior acromioplasty, excision of distal clavicular spur and then arthroscopic rotator cuff repair using two 4 mm Revo screws with #2 Ethibond suture technique.  SURGEON:  Alinda Deem, M.D.  ASSISTANT:  Dorthula Matas, P.A.-C.  ANESTHESIA:  General endotracheal.  Interscalene block.  ESTIMATED BLOOD LOSS:  Minimal.  FLUID REPLACEMENT:  1L of crystalloid.  DRAINS:  None.  TOURNIQUET TIME:  None.  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  MRI scan was accomplished showing a nonretracted complete tear of the supraspinatus tendon.  He also has a 1 cm type 2 to type 3 subacromial spur and he is taken for right shoulder decompression and repair of the rotator cuff technique. The plan was to use a miniarthrotomy we got lucky and were able to do an arthroscopic technique.  DESCRIPTION OF PROCEDURE:  The patient was identified by arm band and taken to the operating room at [facility] where the appropriate anesthetic monitors were attached and general endotracheal anesthesia induced with the patient in the supine position after he first had interscalene  block anesthesia in the block room.  He was then placed in the beach chair position, the right upper extremity prepped and draped in the usual sterile fashion from the wrist to the hemithorax.  The skin along the anterolateral and posterior aspects of the acromion process was infiltrated with 3 to 4 cc of 0.5% Marcaine with epinephrine solution.  Standard portals were made 1.5 cm anterior to the acromion tip, lateral to the junction of the middle and posterior thirds of the acromion, posterior to the posterolateral corner of the acromion process.  Inflow was placed anteriorly.  The arthroscope laterally and a 4.2 mm Great White sucker shaver posteriorly allowing removal of the inflamed subacromial bursa.  We immediately identified the subacromial spur which was removed with a 4.5 mm hooded Vortex bur.  The distal clavicle had autolysed itself except for an inferior spur which was also removed.  The patient was a heavy power lifter and had actually penciled down the tip of his clavicle.  We then identified the nondisplaced 1 cm nonretracted supraspinatus tear that appeared to be full thickness externally but when we inserted the scope into the glenohumeral joint was only a partial thickness tear although looked to be at least 75% of the tendon.  The biceps tendon and the labrum were intact as was the articular cartilage of the shoulder.  Using the 4.2 mm linvatec Great White sucker shaver we cleaned out an area of bone on the greater tuberosity for placement of the Revo screws and an 18 gauge spinal needle was used to localize the  insertion point on the skin so that the screws went in close to perpendicular to the bone.  One was placed anteriorly and one was placed posteriorly in the trough after placement of the large green cannula.  Using a shoot suture punch we then passed one limb of each suture through the rotator cuff tendon and brought it out through the posterior portal and then  using the arthroscopic knot tier we were able to obtain good firm fixation of the rotator cuff tear back to the greater tuberosity. Photographic documentation was made of the rotator cuff repair.  The shoulder was washed out with normal saline solution.  The arthroscopic instruments were removed and the portal used for placement of the sutures was closed with 4-0 horizontal mattress sutures and then the wound was dressed with Xeroform 4 x 4 dressing sponges and paper tape.  The patient was then awakened and taken to the recovery room without difficulty.  The patient was then awakened and taken to the recovery room without difficulty. Dictated by:   Alinda Deem, M.D. Attending Physician:  Alinda Deem DD:  04/16/01 TD:  04/16/01 Job: [id number]

## 2010-08-27 NOTE — Op Note (Signed)
Tyler Larson, Tyler Larson                             ACCOUNT NO.:  [id number]   MEDICAL RECORD NO.:  [id number]                   PATIENT TYPE:  AMB   LOCATION:  DSC                                  FACILITY:  [facility]   PHYSICIAN:  Lubertha Basque. Jerl Santos, M.D.             DATE OF BIRTH:  Jun 01, 1960   DATE OF PROCEDURE:  06/18/2002  DATE OF DISCHARGE:                                 OPERATIVE REPORT   PREOPERATIVE DIAGNOSES:  1. Left knee torn medial meniscus.  2. Left knee loose bodies.  3. Left knee degenerative joint disease.   POSTOPERATIVE DIAGNOSES:  1. Left knee torn medial meniscus.  2. Left knee loose bodies.  3. Left knee degenerative joint disease.   PROCEDURES:  1. Left knee partial medial meniscectomy.  2. Left knee arthroscopic removal loose bodies.  3. Left knee chondroplasty, all three compartments.   ANESTHESIA:  Knee block, MAC.   SURGEON:  Lubertha Basque. Jerl Santos, M.D.   ASSISTANT:  Lindwood Qua, P.A.   INDICATIONS FOR PROCEDURE:  The patient is a 50 year old man about 20 years  out from major reconstructive surgery of the left knee.  He did fairly well  until about four or five months ago when he was running and felt a pop in  his knee.  He has had intense pain and locking since that time.  He has also  persisted with the difficulty throughout the night and a swollen knee.  He  has undergone preoperative MRI scan which shows a medial meniscus tear and  some significant degenerative changes.  He is offered an arthroscopy.  The  procedure was discussed with the patient and informed operative consent was  obtained after discussion of possible complications of, reaction to  anesthesia, and infection.  He also understood about the probability of the  need for further knee surgery in the future.   DESCRIPTION OF PROCEDURE:  The patient was taken to the operating suite  where a knee block supplied as well as MAC.  He was positioned supine and  prepped and draped in a  normal sterile fashion.  After administration of  preoperative antibiotics, an arthroscopy of left knee was performed through  the inferior portals.  Suprapatellar pouch was benign except for several  cartilaginous loose bodies which were removed.  He had grade 3 and 4 change  to the patellofemoral joint and a thorough chondroplasty was done here.  The  medial compartment was notable for a radial tear to the posterior horn and  medial meniscus addressed with about 10% partial medial meniscectomy.  He  also had grade 3 and 4 change across the majority of the medial femoral  condyle with several large osteochondral loose bodies removed.  Large flaps  of articular cartilage were smoothed off with the shaver.  The lateral  compartment had lesser degenerative changes and no meniscal injury.  He did  have several  loose bodies which were removed from this compartment as well.  The knee was thoroughly irrigated at the end of the case followed by  placement of Marcaine with epinephrine and morphine.  Adaptic was placed  over the portals followed by dry gauze and a loose ACE wrap.  Estimated  blood loss and fluids obtained from the anesthesia records.   DISPOSITION:  The patient was extubated in the operating room and taken to  the recovery room in stable condition.  Plans were for him to go home the  same day.  Follow up in the office once a week.  I will contact him by phone  tonight.                                               Lubertha Basque Jerl Santos, M.D.    PGD/MEDQ  D:  06/18/2002  T:  06/18/2002  Job:  [id number]

## 2010-08-27 NOTE — Consult Note (Signed)
[facility]  Patient:    Tyler Larson                         MRN: [id number] Proc. Date: 05/07/99 Adm. Date:  [id number] Attending:  Rosanne Sack CC:         Rosanne Sack, M.D.                          Consultation Report  REASON FOR CONSULTATION:  Cardiomyopathy, new onset congestive heart failure.  REFERRING PHYSICIAN:  Rosanne Sack, M.D.  HISTORY:  Tyler Larson is a 50 year old African-American, who was admitted on May 06, 1999, with the diagnosis of congestive heart failure.  PAST MEDICAL HISTORY:  Significant for upper viral respiratory syndrome back in  December.  Subsequent to his viral syndrome he has had fatigue and increased weakness.  The viral symptoms were treated with Tamiflu and he had some improvement in his symptoms.  On January 11, he developed an upper respiratory infection and was treated with a Z-Pak.  CORONARY RISK FACTORS:  Significant for male only.  There is no history of diabetes, hypertension, tobacco use, illegal drug use, alcohol, or other chemical exposure.  Of note, he states his father died in his mid-40s.  On autopsy an enlarged heart was noted.  Otherwise no family history of cardiac problems.  PAST MEDICAL HISTORY:  1. Recent upper respiratory infection.  2. Obesity.  PAST SURGICAL HISTORY:  He is status post bilateral knee surgery.  SOCIAL HISTORY:  He works as a Nature conservation officer.  No alcohol or tobacco as noted above.  He has two children.  He currently has a significant other but is not married.  FAMILY HISTORY:  No history of coronary artery disease.  History of questionable cardiomyopathy.  Mother has hypertension.  REVIEW OF SYSTEMS:  1. Some increased snoring.  A sleep study is scheduled for Monday.  2. Weight gain of 20 pounds over the past month.  3. Questionable orthopnea.  4. Nonproductive cough.  He denies chest pain, presyncope, bright red blood per  rectum.  No tarry stools. No melena.  CURRENT MEDICATIONS:  1. Lasix 40 IV q.12h.  2. Altace 1.25 p.o. b.i.d.  3. Aldactone 12.5 p.o. q.d.  4. Protonix 40 mg p.o. q.d.  5. K-Dur 20 mEq p.o. q.d.  6. Enteric-coated aspirin 1 tab p.o. q.d.  7. Lovenox 40 mg subcu q.12h.  8. Nitroglycerin 1/2 inch q.8h.  PHYSICAL EXAMINATION:  VITAL SIGNS:  Blood pressure is running 140s-150.  Telemetry has revealed sinus  rhythm, sinus tachycardia.  He has been afebrile.  Blood pressure today has been 106-130s.  HEENT:  Difficult to evaluate.  He has a thick neck, but there is no obvious neck vein distention noted.  The thyroid is nonpalpable.  There are no carotid bruits noted.  PULMONARY:  Exam reveals crackles approximately one-third of the way up.  CARDIOVASCULAR:  PMI is difficult to palpate secondary to his obesity.  There is a normal S1, normal S2.  Irregular rate and rhythm.  There are no rubs, murmurs, r gallops appreciated.  ABDOMEN:  Obese.  Difficult to evaluate.  No hepatosplenomegaly noted.  EXTREMITIES:  He has 1 to 2+ pitting edema.  NEUROLOGIC:  Nonfocal.  Gait is within normal limits.  LABORATORY DATA:  His TSH is 2.184.  His CK has been 500-530.  His MB fraction s negative.  His troponin is  less than 0.03.  His ECG reveals a sinus rhythm.  There is nonspecific T wave changes.  His creatinine is 1.2, total bili is 0.5, potassium 3.9.  Hematocrit 33.4.  White count 12.7.  IMPRESSION:  Congestive heart failure with an anteroseptal wall defect noted on  echo.  Fixed defect in the anterior wall.  Risks and benefits were discussed with the patient.  A left heart catheterization will be scheduled.  The patient will  remain hospitalized and his congestive heart failure will be treated.  Continue  with Lasix.  Increase his Altace as you have done.  Continue Aldactone. Continue aspirin.  Obtain a fasting lipid profile.  Other recommendations will be pending the  left heart catheterization. DD:  05/07/99 TD:  05/09/99 Job: [id number]

## 2010-08-27 NOTE — Op Note (Signed)
NAMELUKAS, PELCHER                 ACCOUNT NO.:  [id number]   MEDICAL RECORD NO.:  [id number]          PATIENT TYPE:  AMB   LOCATION:  SDS                          FACILITY:  [facility]   PHYSICIAN:  Feliberto Gottron. Turner Daniels, M.D.   DATE OF BIRTH:  [birth year]   DATE OF PROCEDURE:  DATE OF DISCHARGE:                               OPERATIVE REPORT   PREOPERATIVE DIAGNOSIS:  Chondromalacia, meniscal tears of the right  knee.   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   PROCEDURE:  Right knee arthroscopic removal of loose bodies, debridement  of extensive chondromalacia, and debridement of meniscal tear.   SURGEON:  Feliberto Gottron. Turner Daniels, M.D.   FIRST ASSISTANT:  Skip Mayer, P.A.-C.   ANESTHESIA:  General LMA with local as well.   ESTIMATED BLOOD LOSS:  Minimal.   FLUID REPLACEMENT:  A liter of crystalloid.   DRAINS PLACED:  None.   TOURNIQUET TIME:  None.   INDICATIONS FOR PROCEDURE:  50 year old gentleman who weighs 410 pounds,  formal football player, who has had multiple procedures on his right  knee starting back in his teens, including what looks like an open MCL  reconstruction done by Dr. Rosanne Ashing __________ back in the 80s.  In any  event, he has catching, popping, and pain in his right knee that has  gotten progressively worse over the last four months.  Plain radiographs  show loss of articular cartilage medially and laterally, some peripheral  osteophytes.  There is crepitus as you take him through range of motion,  and it got so severe over the last week that he actually had to leave  work.  Because of the changes on x-ray and his body habitus, he is felt  to have some fairly obvious meniscal tears and chondromalacia.  He  declined  a cortisone injection and instead opts for arthroscopic  evaluation and treatment of his knee and is aware that at some point he  may need a knee replacement.  He is also aware that he needs to loose  about 210 pounds.  The risks and benefits were discussed preoperatively  and questions answered.   DESCRIPTION OF PROCEDURE:  The patient was identified by armband and  taken to the operating room at [facility] main hospital where the surgery was  done for body habitus.  The appropriate anesthetic monitors were  attached and general LMA anesthesia induced with the patient in the  supine position.  Lateral post applied to the table and the right lower  extremity prepped and draped in the usual sterile fashion from the ankle  to the mid-thigh.   Using a #11 blade, standard inferomedial and inferolateral peripatellar  portals were made allowing introduction of the arthroscope through the  inferolateral portal and the outflow through the inferomedial portal.  Diagnostic arthroscopy revealed chondromalacia of the patella and  trochlea, grade 2  to grade 3, and this was debrided with a 3.5 gator  sucker shaver as was fairly global grade 3 changes to the medial femoral  condyle.  Multiple tears radial and degenerative in nature were found in  the medial meniscus.  These were debrided back to stable margin as well.  Moving into the notch, the ACL was noted be intact.  On the lateral  side, the patient had grade 3 to grade 4 chondromalacia along with a  fairly large drape of lateral femoral condyle.  This was debrided back  to a stable margin, removing some flap tears as well as multiple  cartilaginous loose bodies.  There was some tearing of the anterior horn  of the lateral meniscus.  This was debrided as well.  The gutters were  cleared medially and laterally.  Overall, my impression was that he had  moderate to severe arthritis of his right knee, and he may need a total  knee arthroplasty if this  surgery does not help him.  At this point the  knee was irrigated out with normal saline solution.  The arthroscopic  instruments were removed.  A dressing of Xeroform, 4 x 4 dressing,  sponges, Webril, and Ace wrap applied.   The patient was then awakened and taken to the recovery room without  difficulty after first infiltrating the knee with about 20 mL of 0.5%  Marcaine and epinephrine solution, 5 in each portal and 10 into the  joint itself.      Feliberto Gottron. Turner Daniels, M.D.  Electronically Signed     FJR/MEDQ  D:  08/07/2006  T:  08/07/2006  Job:  [id number]

## 2010-09-16 ENCOUNTER — Other Ambulatory Visit: Payer: BC Managed Care – PPO

## 2010-09-16 ENCOUNTER — Other Ambulatory Visit: Payer: Self-pay | Admitting: Internal Medicine

## 2010-09-16 DIAGNOSIS — Z1289 Encounter for screening for malignant neoplasm of other sites: Secondary | ICD-10-CM

## 2010-09-16 DIAGNOSIS — Z0000 Encounter for general adult medical examination without abnormal findings: Secondary | ICD-10-CM

## 2010-09-19 ENCOUNTER — Encounter: Payer: Self-pay | Admitting: Internal Medicine

## 2010-09-19 DIAGNOSIS — Z0000 Encounter for general adult medical examination without abnormal findings: Secondary | ICD-10-CM | POA: Insufficient documentation

## 2010-09-23 ENCOUNTER — Encounter: Payer: BC Managed Care – PPO | Admitting: Internal Medicine

## 2010-10-15 ENCOUNTER — Ambulatory Visit (HOSPITAL_BASED_OUTPATIENT_CLINIC_OR_DEPARTMENT_OTHER): Payer: BC Managed Care – PPO

## 2010-10-25 ENCOUNTER — Other Ambulatory Visit: Payer: Self-pay | Admitting: Internal Medicine

## 2010-11-19 ENCOUNTER — Ambulatory Visit (HOSPITAL_BASED_OUTPATIENT_CLINIC_OR_DEPARTMENT_OTHER): Payer: BC Managed Care – PPO

## 2010-11-22 ENCOUNTER — Other Ambulatory Visit: Payer: Self-pay | Admitting: Pulmonary Disease

## 2010-11-22 DIAGNOSIS — G4710 Hypersomnia, unspecified: Secondary | ICD-10-CM

## 2010-11-26 ENCOUNTER — Other Ambulatory Visit: Payer: BC Managed Care – PPO

## 2010-12-03 ENCOUNTER — Encounter: Payer: BC Managed Care – PPO | Admitting: Internal Medicine

## 2010-12-15 ENCOUNTER — Other Ambulatory Visit: Payer: Self-pay | Admitting: Endocrinology

## 2010-12-15 NOTE — Telephone Encounter (Signed)
Please advise 

## 2010-12-16 ENCOUNTER — Encounter: Payer: BC Managed Care – PPO | Admitting: Internal Medicine

## 2010-12-16 NOTE — Telephone Encounter (Signed)
Rx faxed to [facility].  

## 2010-12-19 ENCOUNTER — Ambulatory Visit (HOSPITAL_BASED_OUTPATIENT_CLINIC_OR_DEPARTMENT_OTHER): Payer: BC Managed Care – PPO

## 2010-12-20 ENCOUNTER — Other Ambulatory Visit: Payer: Self-pay | Admitting: Pulmonary Disease

## 2010-12-20 DIAGNOSIS — G4710 Hypersomnia, unspecified: Secondary | ICD-10-CM

## 2010-12-20 NOTE — Progress Notes (Signed)
Received call from Denean at sleep lab to reenter order due to pt no showing Sunday's study.

## 2011-01-23 ENCOUNTER — Ambulatory Visit (HOSPITAL_BASED_OUTPATIENT_CLINIC_OR_DEPARTMENT_OTHER): Payer: BC Managed Care – PPO

## 2011-02-09 ENCOUNTER — Encounter: Payer: BC Managed Care – PPO | Admitting: Internal Medicine

## 2011-02-20 ENCOUNTER — Ambulatory Visit (HOSPITAL_BASED_OUTPATIENT_CLINIC_OR_DEPARTMENT_OTHER): Payer: BC Managed Care – PPO

## 2011-03-31 ENCOUNTER — Encounter: Payer: BC Managed Care – PPO | Admitting: Internal Medicine

## 2011-04-07 ENCOUNTER — Ambulatory Visit (HOSPITAL_BASED_OUTPATIENT_CLINIC_OR_DEPARTMENT_OTHER): Payer: BC Managed Care – PPO

## 2011-04-11 ENCOUNTER — Encounter (HOSPITAL_BASED_OUTPATIENT_CLINIC_OR_DEPARTMENT_OTHER): Payer: BC Managed Care – PPO

## 2011-05-12 ENCOUNTER — Encounter: Payer: BC Managed Care – PPO | Admitting: Internal Medicine

## 2011-05-30 ENCOUNTER — Ambulatory Visit (HOSPITAL_BASED_OUTPATIENT_CLINIC_OR_DEPARTMENT_OTHER): Payer: BC Managed Care – PPO

## 2011-06-14 ENCOUNTER — Other Ambulatory Visit (INDEPENDENT_AMBULATORY_CARE_PROVIDER_SITE_OTHER): Payer: BC Managed Care – PPO

## 2011-06-14 DIAGNOSIS — Z1289 Encounter for screening for malignant neoplasm of other sites: Secondary | ICD-10-CM

## 2011-06-14 DIAGNOSIS — Z0000 Encounter for general adult medical examination without abnormal findings: Secondary | ICD-10-CM

## 2011-06-14 LAB — BASIC METABOLIC PANEL
BUN: 17 mg/dL (ref 6–23)
Chloride: 103 mEq/L (ref 96–112)
Potassium: 4.3 mEq/L (ref 3.5–5.1)

## 2011-06-14 LAB — CBC WITH DIFFERENTIAL/PLATELET
Basophils Relative: 0.2 % (ref 0.0–3.0)
Eosinophils Absolute: 0.1 10*3/uL (ref 0.0–0.7)
MCHC: 32.4 g/dL (ref 30.0–36.0)
MCV: 86.6 fl (ref 78.0–100.0)
Monocytes Absolute: 0.8 10*3/uL (ref 0.1–1.0)
Neutrophils Relative %: 66 % (ref 43.0–77.0)
Platelets: 190 10*3/uL (ref 150.0–400.0)

## 2011-06-14 LAB — HEPATIC FUNCTION PANEL
ALT: 26 U/L (ref 0–53)
Albumin: 3.4 g/dL — ABNORMAL LOW (ref 3.5–5.2)
Total Protein: 6.8 g/dL (ref 6.0–8.3)

## 2011-06-14 LAB — URINALYSIS
Hgb urine dipstick: NEGATIVE
Ketones, ur: NEGATIVE
Urine Glucose: NEGATIVE
Urobilinogen, UA: 0.2 (ref 0.0–1.0)

## 2011-06-14 LAB — LIPID PANEL
Cholesterol: 150 mg/dL (ref 0–200)
LDL Cholesterol: 99 mg/dL (ref 0–99)
Triglycerides: 71 mg/dL (ref 0.0–149.0)
VLDL: 14.2 mg/dL (ref 0.0–40.0)

## 2011-06-14 LAB — PSA: PSA: 0.68 ng/mL (ref 0.10–4.00)

## 2011-06-15 ENCOUNTER — Other Ambulatory Visit: Payer: BC Managed Care – PPO

## 2011-06-15 ENCOUNTER — Ambulatory Visit (INDEPENDENT_AMBULATORY_CARE_PROVIDER_SITE_OTHER): Payer: BC Managed Care – PPO | Admitting: Internal Medicine

## 2011-06-15 ENCOUNTER — Encounter: Payer: Self-pay | Admitting: Internal Medicine

## 2011-06-15 VITALS — BP 132/84 | HR 70 | Temp 99.1°F | Ht 72.0 in | Wt 370.5 lb

## 2011-06-15 DIAGNOSIS — A64 Unspecified sexually transmitted disease: Secondary | ICD-10-CM

## 2011-06-15 DIAGNOSIS — M25519 Pain in unspecified shoulder: Secondary | ICD-10-CM

## 2011-06-15 DIAGNOSIS — Z0000 Encounter for general adult medical examination without abnormal findings: Secondary | ICD-10-CM

## 2011-06-15 DIAGNOSIS — Z23 Encounter for immunization: Secondary | ICD-10-CM

## 2011-06-15 DIAGNOSIS — F528 Other sexual dysfunction not due to a substance or known physiological condition: Secondary | ICD-10-CM

## 2011-06-15 DIAGNOSIS — M25511 Pain in right shoulder: Secondary | ICD-10-CM

## 2011-06-15 LAB — HIV ANTIBODY (ROUTINE TESTING W REFLEX): HIV: NONREACTIVE

## 2011-06-15 MED ORDER — SILDENAFIL CITRATE 100 MG PO TABS: 50.0000 mg | ORAL_TABLET | Freq: Every day | ORAL | Status: DC | PRN

## 2011-06-15 MED ORDER — ZOLPIDEM TARTRATE 10 MG PO TABS: 10.0000 mg | ORAL_TABLET | Freq: Every evening | ORAL | Status: DC | PRN

## 2011-06-15 MED ORDER — ACYCLOVIR 200 MG PO CAPS: 200.0000 mg | ORAL_CAPSULE | Freq: Every day | ORAL | Status: DC

## 2011-06-15 NOTE — Assessment & Plan Note (Addendum)
Overall doing well, age appropriate education and counseling updated, referrals for preventative services and immunizations addressed, dietary and smoking counseling addressed, most recent labs and ECG reviewed.  I have personally reviewed and have noted: 1) the patient's medical and social history 2) The pt's use of alcohol, tobacco, and illicit drugs 3) The patient's current medications and supplements 4) Functional ability including ADL's, fall risk, home safety risk, hearing and visual impairment 5) Diet and physical activities 6) Evidence for depression or mood disorder 7) The patient's height, weight, and BMI have been recorded in the chart I have made referrals, and provided counseling and education based on review of the above For tetanus today, ECG reviewed as per emr

## 2011-06-15 NOTE — Assessment & Plan Note (Signed)
Missed his sleep apnea testinglast wk, encouraged to re-schedule

## 2011-06-15 NOTE — Patient Instructions (Signed)
Take all new medications as prescribed - the viagra You had the tetanus shot today Please go to LAB in the Basement for the blood and/or urine tests to be done today Please call the phone number [phone] (the PhoneTree System) for results of testing in 2-3 days;  When calling, simply dial the number, and when prompted enter the MRN number above (the Medical Record Number) and the # key, then the message should start. You will be contacted regarding the referral for: colonoscopy., and orthopedic You are given the refills as you requested Please have the pharmacy call if you need further refills Please re-schedule your sleep study evaluation

## 2011-06-16 LAB — RPR

## 2011-06-19 ENCOUNTER — Encounter: Payer: Self-pay | Admitting: Internal Medicine

## 2011-06-19 NOTE — Assessment & Plan Note (Signed)
For referral to [facility]- s/p rot cuff repair right

## 2011-06-19 NOTE — Progress Notes (Signed)
Subjective:    Patient ID: Tyler Larson, male    DOB: 07/12/1960, 51 y.o.   MRN: [id number]  HPI  Here for wellness and f/u;  Overall doing ok;  Pt denies CP, worsening SOB, DOE, wheezing, orthopnea, PND, worsening LE edema, palpitations, dizziness or syncope.  Pt denies neurological change such as new Headache, facial or extremity weakness.  Pt denies polydipsia, polyuria, or low sugar symptoms. Pt states overall good compliance with treatment and medications, good tolerability, and trying to follow lower cholesterol diet.  Pt denies worsening depressive symptoms, suicidal ideation or panic. No fever, wt loss, night sweats, loss of appetite, or other constitutional symptoms.  Pt states good ability with ADL's, low fall risk, home safety reviewed and adequate, no significant changes in hearing or vision, and occasionally active with exercise.  Ask for HIV testing though has no specific exposure.  Does have right shoudler pain s/p right rotater uff, requests [facility] re=eval.   [facility] Past Medical History  Diagnosis Date  . DJD (degenerative joint disease) of knee   . DJD (degenerative joint disease)     right thumb  . Genital herpes   . ED (erectile dysfunction)   . Allergic rhinitis    Past Surgical History  Procedure Date  . Knee surgery     s/p bilat knee x 5 total  . Rotator cuff repair     s/p right rotator cuff tear  . Shoulder surgery     left shoulder tendon sugery  . Umbilical hernia repair     as infant    reports that he has never smoked. He does not have any smokeless tobacco history on file. He reports that he drinks alcohol. He reports that he does not use illicit drugs. family history includes Arthritis in his mother; Hypertension in his mother; and Stroke in an unspecified family member. No Known Allergies Current Outpatient Prescriptions on File Prior to Visit  Medication Sig Dispense Refill  . Cyanocobalamin 1000 MCG CAPS Take  by mouth daily.        . Multiple Vitamin (MULTIVITAMIN) capsule Take 1 capsule by mouth daily.        . Omega-3 Fatty Acids (FISH OIL) 1200 MG CAPS Take 2 capsules by mouth.        . pyridOXINE (VITAMIN B-6) 100 MG tablet Take 100 mg by mouth daily. Once daily pt unsure of strength        Review of Systems Review of Systems  Constitutional: Negative for diaphoresis, activity change, appetite change and unexpected weight change.  HENT: Negative for hearing loss, ear pain, facial swelling, mouth sores and neck stiffness.   Eyes: Negative for pain, redness and visual disturbance.  Respiratory: Negative for shortness of breath and wheezing.   Cardiovascular: Negative for chest pain and palpitations.  Gastrointestinal: Negative for diarrhea, blood in stool, abdominal distention and rectal pain.  Genitourinary: Negative for hematuria, flank pain and decreased urine volume.  Musculoskeletal: Negative for myalgias and joint swelling.  Skin: Negative for color change and wound.  Neurological: Negative for syncope and numbness.  Hematological: Negative for adenopathy.  Psychiatric/Behavioral: Negative for hallucinations, self-injury, decreased concentration and agitation.      Objective:   Physical Exam BP 132/84  Pulse 70  Temp(Src) 99.1 F (37.3 C) (Oral)  Ht 6' (1.829 m)  Wt 370 lb 8 oz (168.058 kg)  BMI 50.25 kg/m2  SpO2 96% Physical Exam  VS noted, morbid obese  Constitutional: Pt is oriented to person, place, and time. Appears well-developed and well-nourished.  HENT:  Head: Normocephalic and atraumatic.  Right Ear: External ear normal.  Left Ear: External ear normal.  Nose: Nose normal.  Mouth/Throat: Oropharynx is clear and moist.  Eyes: Conjunctivae and EOM are normal. Pupils are equal, round, and reactive to light.  Neck: Normal range of motion. Neck supple. No JVD present. No tracheal deviation present.  Cardiovascular: Normal rate, regular rhythm, normal heart sounds and  intact distal pulses.   Pulmonary/Chest: Effort normal and breath sounds normal.  Abdominal: Soft. Bowel sounds are normal. There is no tenderness.  Musculoskeletal: Normal range of motion. Exhibits no edema.  Lymphadenopathy:  Has no cervical adenopathy.  Neurological: Pt is alert and oriented to person, place, and time. Pt has normal reflexes. No cranial nerve deficit.  Skin: Skin is warm and dry. No rash noted.  Psychiatric:  Has  normal mood and affect. Behavior is normal.     Assessment & Plan:

## 2011-06-19 NOTE — Assessment & Plan Note (Signed)
Ok for viagra rx,  to f/u any worsening symptoms or concerns

## 2011-06-19 NOTE — Assessment & Plan Note (Signed)
For std eval per pt request -  HIV, RPR

## 2011-08-18 ENCOUNTER — Encounter: Payer: Self-pay | Admitting: Gastroenterology

## 2011-12-23 ENCOUNTER — Other Ambulatory Visit: Payer: Self-pay | Admitting: Internal Medicine

## 2011-12-23 NOTE — Telephone Encounter (Signed)
Done hardcopy to [facility]  

## 2011-12-23 NOTE — Telephone Encounter (Signed)
Faxed hardcopy to pharmacy. 

## 2012-07-15 ENCOUNTER — Other Ambulatory Visit: Payer: Self-pay | Admitting: Internal Medicine

## 2012-08-27 ENCOUNTER — Other Ambulatory Visit: Payer: Self-pay | Admitting: Internal Medicine

## 2012-08-27 NOTE — Telephone Encounter (Signed)
Done hardcopy to [facility]  Due for ROV 

## 2012-08-27 NOTE — Telephone Encounter (Signed)
Zolpidem called to pharmacy  

## 2012-10-18 ENCOUNTER — Other Ambulatory Visit: Payer: Self-pay | Admitting: Internal Medicine

## 2012-10-18 NOTE — Telephone Encounter (Signed)
Faxed hardcopy to [facility]

## 2012-10-18 NOTE — Telephone Encounter (Signed)
Done hardcopy to [facility]  

## 2012-11-12 ENCOUNTER — Other Ambulatory Visit: Payer: Self-pay | Admitting: Internal Medicine

## 2012-11-14 ENCOUNTER — Other Ambulatory Visit: Payer: Self-pay | Admitting: Internal Medicine

## 2012-11-14 ENCOUNTER — Telehealth: Payer: Self-pay

## 2012-11-14 DIAGNOSIS — Z0000 Encounter for general adult medical examination without abnormal findings: Secondary | ICD-10-CM

## 2012-11-14 NOTE — Telephone Encounter (Signed)
CPX labs entered  

## 2012-11-16 ENCOUNTER — Other Ambulatory Visit: Payer: Self-pay | Admitting: Internal Medicine

## 2012-11-16 ENCOUNTER — Telehealth: Payer: Self-pay | Admitting: Internal Medicine

## 2012-11-16 MED ORDER — ACYCLOVIR 200 MG PO CAPS: ORAL_CAPSULE | ORAL | Status: DC

## 2012-11-16 NOTE — Telephone Encounter (Signed)
Patient needs refill on Acyclovir, he has a CPE scheduled in December does he need an appointment before then to get this refill?

## 2012-11-16 NOTE — Telephone Encounter (Signed)
rx sent er[facility] to [facility]

## 2013-04-05 ENCOUNTER — Encounter: Payer: BC Managed Care – PPO | Admitting: Internal Medicine

## 2013-04-05 DIAGNOSIS — Z0289 Encounter for other administrative examinations: Secondary | ICD-10-CM

## 2013-05-18 ENCOUNTER — Other Ambulatory Visit: Payer: Self-pay | Admitting: Internal Medicine

## 2013-06-07 ENCOUNTER — Encounter: Payer: Self-pay | Admitting: Internal Medicine

## 2013-06-07 ENCOUNTER — Other Ambulatory Visit (INDEPENDENT_AMBULATORY_CARE_PROVIDER_SITE_OTHER): Payer: BC Managed Care – PPO

## 2013-06-07 ENCOUNTER — Ambulatory Visit (INDEPENDENT_AMBULATORY_CARE_PROVIDER_SITE_OTHER): Payer: BC Managed Care – PPO | Admitting: Internal Medicine

## 2013-06-07 VITALS — BP 142/88 | HR 89 | Temp 99.2°F | Ht 72.0 in | Wt 388.0 lb

## 2013-06-07 DIAGNOSIS — M25519 Pain in unspecified shoulder: Secondary | ICD-10-CM

## 2013-06-07 DIAGNOSIS — Z0000 Encounter for general adult medical examination without abnormal findings: Secondary | ICD-10-CM

## 2013-06-07 DIAGNOSIS — B009 Herpesviral infection, unspecified: Secondary | ICD-10-CM

## 2013-06-07 DIAGNOSIS — R351 Nocturia: Secondary | ICD-10-CM

## 2013-06-07 DIAGNOSIS — G4700 Insomnia, unspecified: Secondary | ICD-10-CM

## 2013-06-07 DIAGNOSIS — M25511 Pain in right shoulder: Secondary | ICD-10-CM | POA: Insufficient documentation

## 2013-06-07 DIAGNOSIS — R030 Elevated blood-pressure reading, without diagnosis of hypertension: Secondary | ICD-10-CM

## 2013-06-07 DIAGNOSIS — G4710 Hypersomnia, unspecified: Secondary | ICD-10-CM

## 2013-06-07 LAB — URINALYSIS, ROUTINE W REFLEX MICROSCOPIC
Bilirubin Urine: NEGATIVE
Hgb urine dipstick: NEGATIVE
KETONES UR: NEGATIVE
Leukocytes, UA: NEGATIVE
Nitrite: NEGATIVE
SPECIFIC GRAVITY, URINE: 1.025 (ref 1.000–1.030)
Total Protein, Urine: NEGATIVE
URINE GLUCOSE: NEGATIVE
UROBILINOGEN UA: 0.2 (ref 0.0–1.0)
pH: 5.5 (ref 5.0–8.0)

## 2013-06-07 LAB — CBC WITH DIFFERENTIAL/PLATELET
Basophils Absolute: 0 10*3/uL (ref 0.0–0.1)
Basophils Relative: 0.4 % (ref 0.0–3.0)
Eosinophils Absolute: 0.1 10*3/uL (ref 0.0–0.7)
Eosinophils Relative: 1.3 % (ref 0.0–5.0)
HCT: 43.4 % (ref 39.0–52.0)
Hemoglobin: 13.7 g/dL (ref 13.0–17.0)
LYMPHS PCT: 20.4 % (ref 12.0–46.0)
Lymphs Abs: 1.9 10*3/uL (ref 0.7–4.0)
MCHC: 31.5 g/dL (ref 30.0–36.0)
MCV: 86.8 fl (ref 78.0–100.0)
MONOS PCT: 8.9 % (ref 3.0–12.0)
Monocytes Absolute: 0.8 10*3/uL (ref 0.1–1.0)
Neutro Abs: 6.6 10*3/uL (ref 1.4–7.7)
Neutrophils Relative %: 69 % (ref 43.0–77.0)
PLATELETS: 215 10*3/uL (ref 150.0–400.0)
RBC: 5 Mil/uL (ref 4.22–5.81)
RDW: 14.9 % — ABNORMAL HIGH (ref 11.5–14.6)
WBC: 9.5 10*3/uL (ref 4.5–10.5)

## 2013-06-07 LAB — BASIC METABOLIC PANEL
BUN: 15 mg/dL (ref 6–23)
CHLORIDE: 104 meq/L (ref 96–112)
CO2: 27 mEq/L (ref 19–32)
Calcium: 8.4 mg/dL (ref 8.4–10.5)
Creatinine, Ser: 1 mg/dL (ref 0.4–1.5)
GFR: 102.72 mL/min (ref >60.00)
Glucose, Bld: 92 mg/dL (ref 70–99)
POTASSIUM: 4.2 meq/L (ref 3.5–5.1)
SODIUM: 137 meq/L (ref 135–145)

## 2013-06-07 LAB — PSA: PSA: 0.72 ng/mL (ref 0.10–4.00)

## 2013-06-07 LAB — LIPID PANEL
CHOLESTEROL: 136 mg/dL (ref 0–200)
HDL: 32.8 mg/dL — AB (ref >39.00)
LDL Cholesterol: 83 mg/dL (ref 0–99)
Total CHOL/HDL Ratio: 4
Triglycerides: 100 mg/dL (ref 0.0–149.0)
VLDL: 20 mg/dL (ref 0.0–40.0)

## 2013-06-07 LAB — HEPATIC FUNCTION PANEL
ALBUMIN: 3.3 g/dL — AB (ref 3.5–5.2)
ALT: 17 U/L (ref 0–53)
AST: 20 U/L (ref 0–37)
Alkaline Phosphatase: 43 U/L (ref 39–117)
Bilirubin, Direct: 0.1 mg/dL (ref 0.0–0.3)
Total Bilirubin: 0.6 mg/dL (ref 0.3–1.2)
Total Protein: 7 g/dL (ref 6.0–8.3)

## 2013-06-07 LAB — TSH: TSH: 1.03 u[IU]/mL (ref 0.35–5.50)

## 2013-06-07 MED ORDER — ZOLPIDEM TARTRATE 10 MG PO TABS: ORAL_TABLET | ORAL | Status: DC

## 2013-06-07 MED ORDER — NAPROXEN 500 MG PO TABS: 500.0000 mg | ORAL_TABLET | Freq: Two times a day (BID) | ORAL | Status: DC

## 2013-06-07 MED ORDER — ACYCLOVIR 200 MG PO CAPS: ORAL_CAPSULE | ORAL | Status: DC

## 2013-06-07 NOTE — Progress Notes (Signed)
Pre visit review using our clinic review tool, if applicable. No additional management support is needed unless otherwise documented below in the visit note. 

## 2013-06-07 NOTE — Assessment & Plan Note (Signed)
Ok for incr zovirax to 400 qd suppressive tx

## 2013-06-07 NOTE — Progress Notes (Signed)
Subjective:    Patient ID: Tyler Larson, male    DOB: [birth year], 53 y.o.   MRN: [id number]  HPI Here for wellness and f/u;  Overall doing ok;  Pt denies CP, worsening SOB, DOE, wheezing, orthopnea, PND, worsening LE edema, palpitations, dizziness or syncope.  Pt denies neurological change such as new headache, facial or extremity weakness.  Pt denies polydipsia, polyuria, or low sugar symptoms. Pt states overall good compliance with treatment and medications, good tolerability, and has been trying to follow lower cholesterol diet.  Pt denies worsening depressive symptoms, suicidal ideation or panic. No fever, night sweats, wt loss, loss of appetite, or other constitutional symptoms.  Pt states good ability with ADL's, has low fall risk, home safety reviewed and adequate, no other significant changes in hearing or vision, and only occasionally active with exercise. Did have recent right shoulder cortisone shot, doing well now. Plans to start wt lifting again soon.  Knees without pain, s/p 2 surg on left, 3 on right. Due for colonoscpy.  Also recently had a genetal herpes outbreak on once per day zovirax.  Still with daytime hypersomnia, did not f/u with sleep study Past Medical History  Diagnosis Date  . DJD (degenerative joint disease) of knee   . DJD (degenerative joint disease)     right thumb  . Genital herpes   . ED (erectile dysfunction)   . Allergic rhinitis    Past Surgical History  Procedure Laterality Date  . Knee surgery      s/p bilat knee x 5 total  . Rotator cuff repair      s/p right rotator cuff tear  . Shoulder surgery      left shoulder tendon sugery  . Umbilical hernia repair      as infant    reports that he has never smoked. He does not have any smokeless tobacco history on file. He reports that he drinks alcohol. He reports that he does not use illicit drugs. family history includes Arthritis in his mother; Hypertension in his mother; Stroke in an other family  member. No Known Allergies Current Outpatient Prescriptions on File Prior to Visit  Medication Sig Dispense Refill  . Cyanocobalamin 1000 MCG CAPS Take by mouth daily.        . Multiple Vitamin (MULTIVITAMIN) capsule Take 1 capsule by mouth daily.        . Omega-3 Fatty Acids (FISH OIL) 1200 MG CAPS Take 2 capsules by mouth.        . pyridOXINE (VITAMIN B-6) 100 MG tablet Take 100 mg by mouth daily. Once daily pt unsure of strength       . sildenafil (VIAGRA) 100 MG tablet Take 0.5-1 tablets (50-100 mg total) by mouth daily as needed for erectile dysfunction.  44 tablet  11   No current facility-administered medications on file prior to visit.    Review of Systems Constitutional: Negative for diaphoresis, activity change, appetite change or unexpected weight change.  HENT: Negative for hearing loss, ear pain, facial swelling, mouth sores and neck stiffness.   Eyes: Negative for pain, redness and visual disturbance.  Respiratory: Negative for shortness of breath and wheezing.   Cardiovascular: Negative for chest pain and palpitations.  Gastrointestinal: Negative for diarrhea, blood in stool, abdominal distention or other pain Genitourinary: Negative for hematuria, flank pain or change in urine volume. but having daytime freq and 2-3 times per night nocturia Musculoskeletal: Negative for myalgias and joint swelling.  Skin: Negative for color  change and wound.  Neurological: Negative for syncope and numbness. other than noted Hematological: Negative for adenopathy.  Psychiatric/Behavioral: Negative for hallucinations, self-injury, decreased concentration and agitation.     Objective:   Physical Exam BP 142/88  Pulse 89  Temp(Src) 99.2 F (37.3 C) (Oral)  Ht 6' (1.829 m)  Wt 388 lb (175.996 kg)  BMI 52.61 kg/m2  SpO2 98% VS noted,  Constitutional: Pt is oriented to person, place, and time. Appears well-developed and well-nourished. /morbid obese Head: Normocephalic and atraumatic.    Right Ear: External ear normal.  Left Ear: External ear normal.  Nose: Nose normal.  Mouth/Throat: Oropharynx is clear and moist.  Eyes: Conjunctivae and EOM are normal. Pupils are equal, round, and reactive to light.  Neck: Normal range of motion. Neck supple. No JVD present. No tracheal deviation present.  Cardiovascular: Normal rate, regular rhythm, normal heart sounds and intact distal pulses.   Pulmonary/Chest: Effort normal and breath sounds normal.  Abdominal: Soft. Bowel sounds are normal. There is no tenderness. No HSM  Musculoskeletal: Normal range of motion. Exhibits no edema.  Lymphadenopathy:  Has no cervical adenopathy.  Neurological: Pt is alert and oriented to person, place, and time. Pt has normal reflexes. No cranial nerve deficit.  Skin: Skin is warm and dry. No rash noted.  Psychiatric:  Has  normal mood and affect. Behavior is normal.         Assessment & Plan:

## 2013-06-07 NOTE — Patient Instructions (Addendum)
Please take all new medication as prescribed - the anti-inflammatory Please continue all other medications as before, and refills have been done if requested, including the zovirax increased to 2 pills per day Please have the pharmacy call with any other refills you may need. Please continue your efforts at being more active, low cholesterol diet, and weight control. You are otherwise up to date with prevention measures today.  You will be contacted regarding the referral for: Colonoscopy, pulmonary for the possible sleep apnea, and urology   Please go to the LAB in the Basement (turn left off the elevator) for the tests to be done today You will be contacted by phone if any changes need to be made immediately.  Otherwise, you will receive a letter about your results with an explanation, but please check with MyChart first.  Please return in 6 months, or sooner if needed  Please continue to monitor your Blood Pressure at work as you suggested, and return if your Blood Pressure is consistently more then 140/90

## 2013-06-07 NOTE — Assessment & Plan Note (Signed)
Ok for pulm referral for possible osa

## 2013-06-07 NOTE — Assessment & Plan Note (Signed)

## 2013-06-07 NOTE — Assessment & Plan Note (Signed)
Ok for ambien refill 

## 2013-06-07 NOTE — Assessment & Plan Note (Signed)
Ok for referral to urology?

## 2013-06-07 NOTE — Assessment & Plan Note (Signed)
Ok for nsaid prn,  to f/u any worsening symptoms or concerns  

## 2013-06-07 NOTE — Assessment & Plan Note (Signed)
Has gained some wt recently, plans to lose, pt states will cont to monitor at work as well, declines med for now

## 2013-06-11 ENCOUNTER — Ambulatory Visit: Payer: BC Managed Care – PPO | Admitting: Pulmonary Disease

## 2013-07-02 ENCOUNTER — Ambulatory Visit: Payer: BC Managed Care – PPO | Admitting: Pulmonary Disease

## 2013-08-19 ENCOUNTER — Encounter: Payer: Self-pay | Admitting: Internal Medicine

## 2013-08-26 ENCOUNTER — Ambulatory Visit: Payer: BC Managed Care – PPO | Admitting: Internal Medicine

## 2013-08-26 DIAGNOSIS — R4689 Other symptoms and signs involving appearance and behavior: Secondary | ICD-10-CM | POA: Insufficient documentation

## 2013-08-26 DIAGNOSIS — Z0289 Encounter for other administrative examinations: Secondary | ICD-10-CM

## 2013-09-03 ENCOUNTER — Telehealth: Payer: Self-pay | Admitting: Internal Medicine

## 2013-09-03 NOTE — Telephone Encounter (Signed)
Rec'd from [facility] forward 2 pages to Dr. Fayrene Fearing

## 2013-09-16 ENCOUNTER — Other Ambulatory Visit: Payer: Self-pay | Admitting: Orthopedic Surgery

## 2013-09-16 DIAGNOSIS — M545 Low back pain, unspecified: Secondary | ICD-10-CM

## 2013-09-24 ENCOUNTER — Ambulatory Visit
Admission: RE | Admit: 2013-09-24 | Discharge: 2013-09-24 | Disposition: A | Discharge status: Home / Self Care | Payer: BC Managed Care – PPO | Source: Ambulatory Visit | Attending: Orthopedic Surgery | Admitting: Orthopedic Surgery

## 2013-09-24 DIAGNOSIS — M545 Low back pain, unspecified: Secondary | ICD-10-CM

## 2013-11-27 ENCOUNTER — Telehealth: Payer: Self-pay | Admitting: Internal Medicine

## 2013-11-27 NOTE — Telephone Encounter (Signed)
Rec'd records form [facility]Forwarding 2 pages to Dr.John Fayrene FearingJames

## 2013-12-06 ENCOUNTER — Ambulatory Visit: Payer: BC Managed Care – PPO | Admitting: Internal Medicine

## 2014-01-03 ENCOUNTER — Ambulatory Visit: Payer: BC Managed Care – PPO | Admitting: Internal Medicine

## 2014-01-30 ENCOUNTER — Ambulatory Visit: Payer: BC Managed Care – PPO | Admitting: Internal Medicine

## 2014-02-25 ENCOUNTER — Ambulatory Visit: Payer: BC Managed Care – PPO | Admitting: Internal Medicine

## 2014-03-26 ENCOUNTER — Telehealth: Payer: Self-pay | Admitting: Internal Medicine

## 2014-03-26 NOTE — Telephone Encounter (Signed)
[facility] Sent to Dr. Jonny RuizJohn. 03/26/14/ss.

## 2014-07-22 ENCOUNTER — Other Ambulatory Visit: Payer: Self-pay | Admitting: Internal Medicine

## 2014-08-26 ENCOUNTER — Encounter: Payer: Self-pay | Admitting: Internal Medicine

## 2014-08-26 ENCOUNTER — Other Ambulatory Visit (INDEPENDENT_AMBULATORY_CARE_PROVIDER_SITE_OTHER): Payer: BLUE CROSS/BLUE SHIELD

## 2014-08-26 ENCOUNTER — Ambulatory Visit (INDEPENDENT_AMBULATORY_CARE_PROVIDER_SITE_OTHER): Payer: BLUE CROSS/BLUE SHIELD | Admitting: Internal Medicine

## 2014-08-26 VITALS — BP 136/90 | HR 77 | Temp 98.9°F | Wt 358.2 lb

## 2014-08-26 DIAGNOSIS — Z202 Contact with and (suspected) exposure to infections with a predominantly sexual mode of transmission: Secondary | ICD-10-CM

## 2014-08-26 DIAGNOSIS — R350 Frequency of micturition: Secondary | ICD-10-CM | POA: Insufficient documentation

## 2014-08-26 DIAGNOSIS — Z0000 Encounter for general adult medical examination without abnormal findings: Secondary | ICD-10-CM

## 2014-08-26 DIAGNOSIS — M545 Low back pain, unspecified: Secondary | ICD-10-CM

## 2014-08-26 DIAGNOSIS — M25511 Pain in right shoulder: Secondary | ICD-10-CM

## 2014-08-26 LAB — CBC WITH DIFFERENTIAL/PLATELET
BASOS PCT: 0.3 % (ref 0.0–3.0)
Basophils Absolute: 0 10*3/uL (ref 0.0–0.1)
EOS ABS: 0.1 10*3/uL (ref 0.0–0.7)
EOS PCT: 1.5 % (ref 0.0–5.0)
HEMATOCRIT: 44.2 % (ref 39.0–52.0)
Hemoglobin: 14.4 g/dL (ref 13.0–17.0)
LYMPHS ABS: 1.8 10*3/uL (ref 0.7–4.0)
Lymphocytes Relative: 21.6 % (ref 12.0–46.0)
MCHC: 32.5 g/dL (ref 30.0–36.0)
MCV: 82.6 fl (ref 78.0–100.0)
MONO ABS: 0.9 10*3/uL (ref 0.1–1.0)
Monocytes Relative: 10.2 % (ref 3.0–12.0)
Neutro Abs: 5.6 10*3/uL (ref 1.4–7.7)
Neutrophils Relative %: 66.4 % (ref 43.0–77.0)
PLATELETS: 218 10*3/uL (ref 150.0–400.0)
RBC: 5.35 Mil/uL (ref 4.22–5.81)
RDW: 14.9 % (ref 11.5–15.5)
WBC: 8.4 10*3/uL (ref 4.0–10.5)

## 2014-08-26 LAB — URINALYSIS, ROUTINE W REFLEX MICROSCOPIC
Bilirubin Urine: NEGATIVE
Hgb urine dipstick: NEGATIVE
Ketones, ur: NEGATIVE
LEUKOCYTES UA: NEGATIVE
Nitrite: NEGATIVE
PH: 5.5 (ref 5.0–8.0)
RBC / HPF: NONE SEEN (ref 0–?)
TOTAL PROTEIN, URINE-UPE24: NEGATIVE
Urine Glucose: NEGATIVE
Urobilinogen, UA: 0.2 (ref 0.0–1.0)

## 2014-08-26 LAB — HEPATIC FUNCTION PANEL
ALT: 16 U/L (ref 0–53)
AST: 17 U/L (ref 0–37)
Albumin: 3.7 g/dL (ref 3.5–5.2)
Alkaline Phosphatase: 49 U/L (ref 39–117)
BILIRUBIN TOTAL: 0.4 mg/dL (ref 0.2–1.2)
Bilirubin, Direct: 0.1 mg/dL (ref 0.0–0.3)
TOTAL PROTEIN: 6.8 g/dL (ref 6.0–8.3)

## 2014-08-26 LAB — BASIC METABOLIC PANEL
BUN: 15 mg/dL (ref 6–23)
CALCIUM: 8.5 mg/dL (ref 8.4–10.5)
CO2: 29 meq/L (ref 19–32)
Chloride: 107 mEq/L (ref 96–112)
Creatinine, Ser: 0.99 mg/dL (ref 0.40–1.50)
GFR: 101.06 mL/min (ref >60.00)
Glucose, Bld: 110 mg/dL — ABNORMAL HIGH (ref 70–99)
POTASSIUM: 4.3 meq/L (ref 3.5–5.1)
SODIUM: 140 meq/L (ref 135–145)

## 2014-08-26 LAB — LIPID PANEL
Cholesterol: 138 mg/dL (ref 0–200)
HDL: 35.2 mg/dL — AB (ref >39.00)
LDL Cholesterol: 90 mg/dL (ref 0–99)
NonHDL: 102.8
Total CHOL/HDL Ratio: 4
Triglycerides: 63 mg/dL (ref 0.0–149.0)
VLDL: 12.6 mg/dL (ref 0.0–40.0)

## 2014-08-26 LAB — PSA: PSA: 0.77 ng/mL (ref 0.10–4.00)

## 2014-08-26 LAB — TSH: TSH: 2.02 u[IU]/mL (ref 0.35–4.50)

## 2014-08-26 MED ORDER — METAXALONE 800 MG PO TABS: 800.0000 mg | ORAL_TABLET | Freq: Two times a day (BID) | ORAL | Status: DC

## 2014-08-26 NOTE — Progress Notes (Signed)
Pre visit review using our clinic review tool, if applicable. No additional management support is needed unless otherwise documented below in the visit note. 

## 2014-08-26 NOTE — Assessment & Plan Note (Signed)
Not charged today, but for colonscopy as he is due and yearly labs, plans to f/u sept 2016 for further exam

## 2014-08-26 NOTE — Assessment & Plan Note (Signed)
Pt asks for HIV testing, will do today, declines other such as RPR

## 2014-08-26 NOTE — Progress Notes (Signed)
Subjective:    Patient ID: Tyler Larson, male    DOB: 1960/10/25, 54 y.o.   MRN: [id number]  HPI  Here to f/u, gained 20 lbs, only occasionally active with exercise. Has a home gym, plans to do more..  Pt continues to have recurring LBP without change in severity, bowel or bladder change, fever, wt loss,  worsening LE pain/numbness/weakness, gait change or falls. Did have MRI ans saw Dr Renae FicklePaul, with disc dz, out of work x 4 mo, no surgury in the end, did better with skelaxin and asks for refill.  Also has hx of right rot cuff tear injury where it hurt to work and pick up gallon of milk, still hurts off and no fo rseveal months,makes it diffucult to exercise.  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  Has urinary freq as well up to 4 times at work, then 3 times at night Denies urinary symptoms such as dysuria, urgency, flank pain, hematuria or n/v, fever, chills.    Past Medical History  Diagnosis Date  . DJD (degenerative joint disease) of knee   . DJD (degenerative joint disease)     right thumb  . Genital herpes   . ED (erectile dysfunction)   . Allergic rhinitis    Past Surgical History  Procedure Laterality Date  . Knee surgery      s/p bilat knee x 5 total  . Rotator cuff repair      s/p right rotator cuff tear  . Shoulder surgery      left shoulder tendon sugery  . Umbilical hernia repair      as infant    reports that he has never smoked. He does not have any smokeless tobacco history on file. He reports that he drinks alcohol. He reports that he does not use illicit drugs. family history includes Arthritis in his mother; Hypertension in his mother; Stroke in an other family member. No Known Allergies Current Outpatient Prescriptions on File Prior to Visit  Medication Sig Dispense Refill  . acyclovir (ZOVIRAX) 200 MG capsule TAKE TWO CAPSULES BY MOUTH ONCE DAILY 60 capsule 0  . Cyanocobalamin 1000 MCG CAPS Take by mouth  daily.      . Multiple Vitamin (MULTIVITAMIN) capsule Take 1 capsule by mouth daily.      . Omega-3 Fatty Acids (FISH OIL) 1200 MG CAPS Take 2 capsules by mouth.      . pyridOXINE (VITAMIN B-6) 100 MG tablet Take 100 mg by mouth daily. Once daily pt unsure of strength     . zolpidem (AMBIEN) 10 MG tablet TAKE ONE TABLET BY MOUTH EVERY DAY AT BEDTIME AS NEEDED FOR SLEEP 90 tablet 1  . naproxen (NAPROSYN) 500 MG tablet Take 1 tablet (500 mg total) by mouth 2 (two) times daily with a meal. (Patient not taking: Reported on 08/26/2014) 60 tablet 2  . sildenafil (VIAGRA) 100 MG tablet Take 0.5-1 tablets (50-100 mg total) by mouth daily as needed for erectile dysfunction. 44 tablet 11   No current facility-administered medications on file prior to visit.   Review of Systems  Constitutional: Negative for unusual diaphoresis or night sweats HENT: Negative for ringing in ear or discharge Eyes: Negative for double vision or worsening visual disturbance.  Respiratory: Negative for choking and stridor.   Gastrointestinal: Negative for vomiting or other signifcant bowel change Genitourinary: Negative for hematuria or change in urine volume.  Musculoskeletal:  Negative for other MSK pain or swelling Skin: Negative for color change and worsening wound.  Neurological: Negative for tremors and numbness other than noted  Psychiatric/Behavioral: Negative for decreased concentration or agitation other than above  '     Objective:   Physical Exam BP 136/90 mmHg  Pulse 77  Temp(Src) 98.9 F (37.2 C) (Oral)  Wt 358 lb 3.2 oz (162.478 kg)  SpO2 98% VS noted,  Constitutional: Pt appears in no significant distress, morbid obese HENT: Head: NCAT.  Right Ear: External ear normal.  Left Ear: External ear normal.  Eyes: . Pupils are equal, round, and reactive to light. Conjunctivae and EOM are normal Neck: Normal range of motion. Neck supple.  Cardiovascular: Normal rate and regular rhythm.   Pulmonary/Chest:  Effort normal and breath sounds without rales or wheezing.  Abd:  Soft, NT, ND, + BS Neurological: Pt is alert. Not confused , motor grossly intact Skin: Skin is warm. No rash, no LE edema Psychiatric: Pt behavior is normal. No agitation.  Spine nontender Right shoudler FROm, some tender noted to right bicipital tendon insertion site    Assessment & Plan:

## 2014-08-26 NOTE — Assessment & Plan Note (Signed)
With hx of lumbar disc dz and spinal stenosis, for skelaxin bid prn, to f/u any worsening symptoms or concerns

## 2014-08-26 NOTE — Assessment & Plan Note (Signed)
C/w mild tendonitis, delcines nsiad , for skelaxin prn,  to f/u any worsening symptoms or concerns

## 2014-08-26 NOTE — Assessment & Plan Note (Signed)
Etiology unclear, no hx of DM, for UA today, check glc, consider urology if persists

## 2014-08-26 NOTE — Patient Instructions (Signed)
Please continue all other medications as before, and refills have been done if requested - the skelaxin  Please have the pharmacy call with any other refills you may need.  Please continue your efforts at being more active, low cholesterol diet, and weight control.  Please keep your appointments with your specialists as you may have planned  You will be contacted regarding the referral for: colonoscopy  Please go to the LAB in the Basement (turn left off the elevator) for the tests to be done today  You will be contacted by phone if any changes need to be made immediately.  Otherwise, you will receive a letter about your results with an explanation, but please check with MyChart first.  Please remember to sign up for MyChart if you have not done so, as this will be important to you in the future with finding out test results, communicating by private email, and scheduling acute appointments online when needed.

## 2014-08-27 ENCOUNTER — Encounter: Payer: Self-pay | Admitting: Internal Medicine

## 2014-08-27 LAB — HIV ANTIBODY (ROUTINE TESTING W REFLEX): HIV: NONREACTIVE

## 2014-08-28 ENCOUNTER — Other Ambulatory Visit: Payer: Self-pay | Admitting: Internal Medicine

## 2014-09-12 ENCOUNTER — Encounter: Payer: Self-pay | Admitting: Internal Medicine

## 2014-12-02 ENCOUNTER — Other Ambulatory Visit: Payer: Self-pay

## 2014-12-02 ENCOUNTER — Telehealth: Payer: Self-pay

## 2014-12-02 ENCOUNTER — Ambulatory Visit (AMBULATORY_SURGERY_CENTER): Payer: Self-pay

## 2014-12-02 VITALS — Ht 71.5 in | Wt 388.0 lb

## 2014-12-02 DIAGNOSIS — Z1211 Encounter for screening for malignant neoplasm of colon: Secondary | ICD-10-CM

## 2014-12-02 MED ORDER — SUPREP BOWEL PREP KIT 17.5-3.13-1.6 GM/177ML PO SOLN: 1.0000 | Freq: Once | ORAL | Status: DC

## 2014-12-02 NOTE — Telephone Encounter (Signed)
BMI 53.37 please schedule at [facility].  Call pt.  He has already had his PV today on 12/02/14  Thank you, Marylene Land / PV

## 2014-12-02 NOTE — Telephone Encounter (Signed)
Spoke with pt and he is aware colon has been moved to [facility] 01/13/15@10 :15am., pt will need to arrive there at 8:45am. Pt mailed updated prep instructions.

## 2014-12-02 NOTE — Progress Notes (Signed)
No allergies to eggs or soy No diet/weight loss meds No home oxygen No past problems with anesthesia  Refused emmi 

## 2014-12-12 ENCOUNTER — Encounter: Payer: BLUE CROSS/BLUE SHIELD | Admitting: Internal Medicine

## 2014-12-16 ENCOUNTER — Encounter: Payer: Self-pay | Admitting: Internal Medicine

## 2014-12-17 ENCOUNTER — Encounter: Payer: BLUE CROSS/BLUE SHIELD | Admitting: Internal Medicine

## 2014-12-31 ENCOUNTER — Encounter (HOSPITAL_COMMUNITY): Payer: Self-pay | Admitting: *Deleted

## 2015-01-13 ENCOUNTER — Encounter (HOSPITAL_COMMUNITY)
Admission: RE | Disposition: A | Discharge status: Home / Self Care | Payer: Self-pay | Source: Ambulatory Visit | Attending: Internal Medicine

## 2015-01-13 ENCOUNTER — Ambulatory Visit (HOSPITAL_COMMUNITY)
Admission: RE | Admit: 2015-01-13 | Discharge: 2015-01-13 | Disposition: A | Discharge status: Home / Self Care | Payer: BLUE CROSS/BLUE SHIELD | Source: Ambulatory Visit | Attending: Internal Medicine | Admitting: Internal Medicine

## 2015-01-13 ENCOUNTER — Ambulatory Visit (HOSPITAL_COMMUNITY): Payer: BLUE CROSS/BLUE SHIELD | Admitting: Registered Nurse

## 2015-01-13 ENCOUNTER — Encounter (HOSPITAL_COMMUNITY): Payer: Self-pay

## 2015-01-13 DIAGNOSIS — K5730 Diverticulosis of large intestine without perforation or abscess without bleeding: Secondary | ICD-10-CM | POA: Insufficient documentation

## 2015-01-13 DIAGNOSIS — M19049 Primary osteoarthritis, unspecified hand: Secondary | ICD-10-CM | POA: Diagnosis not present

## 2015-01-13 DIAGNOSIS — Z6841 Body Mass Index (BMI) 40.0 and over, adult: Secondary | ICD-10-CM | POA: Diagnosis not present

## 2015-01-13 DIAGNOSIS — Z1211 Encounter for screening for malignant neoplasm of colon: Secondary | ICD-10-CM | POA: Diagnosis not present

## 2015-01-13 DIAGNOSIS — M179 Osteoarthritis of knee, unspecified: Secondary | ICD-10-CM | POA: Insufficient documentation

## 2015-01-13 HISTORY — PX: COLONOSCOPY: SHX5424

## 2015-01-13 SURGERY — COLONOSCOPY
Anesthesia: Monitor Anesthesia Care

## 2015-01-13 MED ORDER — LACTATED RINGERS IV SOLN
INTRAVENOUS | Status: DC
  Administered 2015-01-13: 1000 mL via INTRAVENOUS

## 2015-01-13 MED ORDER — PROPOFOL 10 MG/ML IV BOLUS
INTRAVENOUS | Status: AC
  Filled 2015-01-13: qty 40

## 2015-01-13 MED ORDER — SODIUM CHLORIDE 0.9 % IV SOLN: INTRAVENOUS | Status: DC

## 2015-01-13 MED ORDER — PROPOFOL 10 MG/ML IV BOLUS
INTRAVENOUS | Status: AC
  Filled 2015-01-13: qty 20

## 2015-01-13 MED ORDER — PROPOFOL 500 MG/50ML IV EMUL
INTRAVENOUS | Status: DC | PRN
  Administered 2015-01-13: 200 ug/kg/min via INTRAVENOUS

## 2015-01-13 NOTE — Anesthesia Preprocedure Evaluation (Signed)
Anesthesia Evaluation  Patient identified by MRN, date of birth, ID band Patient awake    Reviewed: Allergy & Precautions, NPO status , Patient's Chart, lab work & pertinent test results  Airway Mallampati: II  TM Distance: >3 FB Neck ROM: Full    Dental no notable dental hx.    Pulmonary neg pulmonary ROS,    Pulmonary exam normal breath sounds clear to auscultation       Cardiovascular negative cardio ROS Normal cardiovascular exam Rhythm:Regular Rate:Normal     Neuro/Psych negative neurological ROS  negative psych ROS   GI/Hepatic negative GI ROS, Neg liver ROS,   Endo/Other  Morbid obesity  Renal/GU negative Renal ROS  negative genitourinary   Musculoskeletal negative musculoskeletal ROS (+)   Abdominal   Peds negative pediatric ROS (+)  Hematology negative hematology ROS (+)   Anesthesia Other Findings   Reproductive/Obstetrics negative OB ROS                             Anesthesia Physical Anesthesia Plan  ASA: III  Anesthesia Plan: MAC   Post-op Pain Management:    Induction:   Airway Management Planned: Simple Face Mask  Additional Equipment:   Intra-op Plan:   Post-operative Plan:   Informed Consent: I have reviewed the patients History and Physical, chart, labs and discussed the procedure including the risks, benefits and alternatives for the proposed anesthesia with the patient or authorized representative who has indicated his/her understanding and acceptance.   Dental advisory given  Plan Discussed with: CRNA  Anesthesia Plan Comments:         Anesthesia Quick Evaluation  

## 2015-01-13 NOTE — Op Note (Signed)
[facility] [address] [place], [address]   COLONOSCOPY PROCEDURE REPORT  PATIENT: Tyler Larson, Tyler Larson  MR#: [id number] BIRTHDATE: 09-11-1960 , 54  yrs. old GENDER: male ENDOSCOPIST: Beverley Fiedler, MD REFERRED XB:JYNWG John, M.D. PROCEDURE DATE:  01/13/2015 PROCEDURE:   Colonoscopy, screening First Screening Colonoscopy - Avg.  risk and is 50 yrs.  old or older Yes.  Prior Negative Screening - Now for repeat screening. N/A  History of Adenoma - Now for follow-up colonoscopy & has been > or = to 3 yrs.  N/A  Polyps removed today? Yes ASA CLASS:   Class III INDICATIONS:Screening for colonic neoplasia, Colorectal Neoplasm Risk Assessment for this procedure is average risk, and 1st colonoscopy. MEDICATIONS: Monitored anesthesia care and Per Anesthesia  DESCRIPTION OF PROCEDURE:   After the risks benefits and alternatives of the procedure were thoroughly explained, informed consent was obtained.  The digital rectal exam revealed no rectal mass.   The Pentax Adult Colonoscope [id number]  endoscope was introduced through the anus and advanced to the terminal ileum which was intubated for a short distance. No adverse events experienced.   The quality of the prep was good.  (Suprep was used) The instrument was then slowly withdrawn as the colon was fully examined. Estimated blood loss is zero unless otherwise noted in this procedure report.    COLON FINDINGS: Normal examined terminal ileum.  There was mild diverticulosis noted at the cecum and in the left colon.   The examination was otherwise normal.  No polyps or cancers seen. Retroflexed views revealed no abnormalities. The time to cecum = 2.0 Withdrawal time = 9.2   The scope was withdrawn and the procedure completed.  COMPLICATIONS: There were no complications.  ENDOSCOPIC IMPRESSION: 1.   Mild diverticulosis was noted at the cecum and in the left colon 2.   The examination was otherwise  normal  RECOMMENDATIONS: You should continue to follow colorectal cancer screening guidelines for "routine risk" patients with a repeat colonoscopy in 10 years. There is no need for FOBT (stool) testing for at least 5 years.  eSigned:  Beverley Fiedler, MD 01/13/2015 10:32 AM   cc: Corwin Levins, MD and The Patient

## 2015-01-13 NOTE — Transfer of Care (Signed)
Immediate Anesthesia Transfer of Care Note  Patient: Tyler Larson  Procedure(s) Performed: Procedure(s): COLONOSCOPY (N/A)  Patient Location: PACU  Anesthesia Type:MAC  Level of Consciousness: awake, alert , oriented and patient cooperative  Airway & Oxygen Therapy: Patient Spontanous Breathing and Patient connected to face mask oxygen  Post-op Assessment: Report given to RN, Post -op Vital signs reviewed and stable and Patient moving all extremities X 4  Post vital signs: stable  Last Vitals:  Filed Vitals:   01/13/15 0929  BP: 127/69  Pulse: 85  Temp: 37.7 C  Resp: 18    Complications: No apparent anesthesia complications

## 2015-01-13 NOTE — Discharge Instructions (Signed)

## 2015-01-13 NOTE — H&P (Signed)
HPI: Tyler Larson is a 54 year old male with past medical history of degenerative joint disease, obesity who presents for outpatient screening colonoscopy. He sees Dr. Jonny Ruiz for primary care. He denies a change in bowel habits including diarrhea, constipation, blood in his stool or melena. No abdominal pain. Tolerated the prep well. Denies family history of colon cancer or polyps.  Past Medical History  Diagnosis Date  . DJD (degenerative joint disease) of knee   . DJD (degenerative joint disease)     right thumb  . Genital herpes   . ED (erectile dysfunction)   . Allergic rhinitis     Past Surgical History  Procedure Laterality Date  . Knee surgery      s/p bilat knee x 5 total  . Rotator cuff repair      s/p right rotator cuff tear  . Shoulder surgery      left shoulder tendon sugery  . Umbilical hernia repair      as infant     (Not in an outpatient encounter)  Allergies  Allergen Reactions  . Nsaids Other (See Comments)    brusing    Family History  Problem Relation Age of Onset  . Arthritis Mother   . Hypertension Mother   . Stroke    . Colon polyps Neg Hx   . Colon cancer Neg Hx     Social History  Substance Use Topics  . Smoking status: Never Smoker   . Smokeless tobacco: Never Used  . Alcohol Use: No    ROS: As per history of present illness, otherwise negative  BP 127/69 mmHg  Pulse 85  Temp(Src) 99.8 F (37.7 C) (Oral)  Resp 18  Ht 5' 11.5" (1.816 m)  Wt 388 lb (175.996 kg)  BMI 53.37 kg/m2  SpO2 99% Gen: awake, alert, NAD HEENT: anicteric, op clear CV: RRR, no mrg Pulm: CTA b/l Abd: soft, obese,  NT/ND, +BS throughout Ext: no c/c/e Neuro: nonfocal   RELEVANT LABS AND IMAGING: CBC    Component Value Date/Time   WBC 8.4 08/26/2014 0919   RBC 5.35 08/26/2014 0919   HGB 14.4 08/26/2014 0919   HCT 44.2 08/26/2014 0919   PLT 218.0 08/26/2014 0919   MCV 82.6 08/26/2014 0919   MCHC 32.5 08/26/2014 0919   RDW 14.9 08/26/2014 0919   LYMPHSABS 1.8 08/26/2014 0919   MONOABS 0.9 08/26/2014 0919   EOSABS 0.1 08/26/2014 0919   BASOSABS 0.0 08/26/2014 0919    CMP     Component Value Date/Time   NA 140 08/26/2014 0919   K 4.3 08/26/2014 0919   CL 107 08/26/2014 0919   CO2 29 08/26/2014 0919   GLUCOSE 110* 08/26/2014 0919   BUN 15 08/26/2014 0919   CREATININE 0.99 08/26/2014 0919   CALCIUM 8.5 08/26/2014 0919   PROT 6.8 08/26/2014 0919   ALBUMIN 3.7 08/26/2014 0919   AST 17 08/26/2014 0919   ALT 16 08/26/2014 0919   ALKPHOS 49 08/26/2014 0919   BILITOT 0.4 08/26/2014 0919   GFRNONAA 101.88 08/04/2009 1203   GFRAA 92 05/12/2008 1541    ASSESSMENT/PLAN: 54 year old male with past medical history of degenerative joint disease, obesity who presents for outpatient screening colonoscopy.   1. CRC screening -- average risk present for colonoscopy with monitored anesthesia care.  The nature of the procedure, as well as the risks, benefits, and alternatives were carefully and thoroughly reviewed with the patient. Ample time for discussion and questions allowed. The patient understood, was satisfied, and agreed to  proceed.

## 2015-01-14 ENCOUNTER — Encounter (HOSPITAL_COMMUNITY): Payer: Self-pay | Admitting: Internal Medicine

## 2015-01-14 NOTE — Anesthesia Postprocedure Evaluation (Signed)
  Anesthesia Post-op Note  Patient: Tyler Larson  Procedure(s) Performed: Procedure(s) (LRB): COLONOSCOPY (N/A)  Patient Location: PACU  Anesthesia Type: MAC  Level of Consciousness: awake and alert   Airway and Oxygen Therapy: Patient Spontanous Breathing  Post-op Pain: mild  Post-op Assessment: Post-op Vital signs reviewed, Patient's Cardiovascular Status Stable, Respiratory Function Stable, Patent Airway and No signs of Nausea or vomiting  Last Vitals:  Filed Vitals:   01/13/15 1056  BP: 100/52  Pulse:   Temp:   Resp: 18    Post-op Vital Signs: stable   Complications: No apparent anesthesia complications

## 2015-02-25 ENCOUNTER — Other Ambulatory Visit: Payer: Self-pay | Admitting: Internal Medicine

## 2015-06-29 ENCOUNTER — Encounter: Payer: Self-pay | Admitting: Adult Health

## 2015-06-29 ENCOUNTER — Ambulatory Visit (INDEPENDENT_AMBULATORY_CARE_PROVIDER_SITE_OTHER): Payer: Managed Care, Other (non HMO) | Admitting: Adult Health

## 2015-06-29 DIAGNOSIS — J029 Acute pharyngitis, unspecified: Secondary | ICD-10-CM

## 2015-06-29 LAB — POCT MONO (EPSTEIN BARR VIRUS): Mono, POC: NEGATIVE

## 2015-06-29 LAB — POCT RAPID STREP A (OFFICE): Rapid Strep A Screen: NEGATIVE

## 2015-06-29 MED ORDER — METHYLPREDNISOLONE ACETATE 80 MG/ML IJ SUSP
160.0000 mg | Freq: Once | INTRAMUSCULAR | Status: AC
  Administered 2015-06-29: 160 mg via INTRAMUSCULAR

## 2015-06-29 MED ORDER — MAGIC MOUTHWASH W/LIDOCAINE: 5.0000 mL | Freq: Three times a day (TID) | ORAL | Status: DC | PRN

## 2015-06-29 MED ORDER — CEFTRIAXONE SODIUM 1 G IJ SOLR
1.0000 g | INTRAMUSCULAR | Status: DC
  Administered 2015-06-29: 1 g via INTRAMUSCULAR

## 2015-06-29 NOTE — Patient Instructions (Addendum)
Your strep and mono tests came back negative. I am going to send the culture out and we will treat you for strep throat.   If you develop and worsening breathing difficulty, cannot swallow, are drooling then please go to the ER.   Follow up with me tomorrow at 11

## 2015-06-29 NOTE — Progress Notes (Signed)
Subjective:    Patient ID: Tyler Larson, male    DOB: 1960-08-16, 55 y.o.   MRN: [id number]  Sore Throat  This is a new problem. The current episode started in the past 7 days. The problem has been rapidly worsening. Neither side of throat is experiencing more pain than the other. The maximum temperature recorded prior to his arrival was 100.4 - 100.9 F. The pain is at a severity of 10/10. The pain is severe. Associated symptoms include coughing, drooling, a hoarse voice, neck pain, swollen glands and trouble swallowing. Pertinent negatives include no ear discharge or ear pain. He has tried NSAIDs, acetaminophen and cool liquids for the symptoms. The treatment provided no relief.   Denies any difficulty breathing. Swallowing feels like razor blades. He has trouble swallowing fluids   Review of Systems  Constitutional: Positive for fever, appetite change and fatigue.  HENT: Positive for drooling, hoarse voice, sinus pressure, sore throat, trouble swallowing and voice change. Negative for ear discharge, ear pain and tinnitus.   Respiratory: Positive for cough.   Musculoskeletal: Positive for neck pain.  Skin: Negative.   Neurological: Negative.   Hematological: Positive for adenopathy.   Past Medical History  Diagnosis Date  . DJD (degenerative joint disease) of knee   . DJD (degenerative joint disease)     right thumb  . Genital herpes   . ED (erectile dysfunction)   . Allergic rhinitis     Social History   Social History  . Marital Status: Single    Spouse Name: N/A  . Number of Children: 2  . Years of Education: N/A   Occupational History  . 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 Smoking status: Never Smoker   . Smokeless tobacco: Never Used  . Alcohol Use: No  . Drug Use: No  . Sexual Activity: Not on file   Other Topics Concern  . Not on file   Social History Narrative    Past Surgical History  Procedure Laterality Date    . Knee surgery      s/p bilat knee x 5 total  . Rotator cuff repair      s/p right rotator cuff tear  . Shoulder surgery      left shoulder tendon sugery  . Umbilical hernia repair      as infant  . Colonoscopy N/A 01/13/2015    Procedure: COLONOSCOPY;  Surgeon: Beverley Fiedler, MD;  Location: [facility];  Service: Gastroenterology;  Laterality: N/A;    Family History  Problem Relation Age of Onset  . Arthritis Mother   . Hypertension Mother   . Stroke    . Colon polyps Neg Hx   . Colon cancer Neg Hx     Allergies  Allergen Reactions  . Nsaids Other (See Comments)    brusing    Current Outpatient Prescriptions on File Prior to Visit  Medication Sig Dispense Refill  . acyclovir (ZOVIRAX) 200 MG capsule TAKE ONE CAPSULE BY MOUTH TWICE DAILY 60 capsule 2  . Cyanocobalamin 1000 MCG CAPS Take 1 tablet by mouth daily.     . metaxalone (SKELAXIN) 800 MG tablet Take 1 tablet (800 mg total) by mouth 2 (two) times daily. 60 tablet 5  . Multiple Vitamin (MULTIVITAMIN) capsule Take 1 capsule by mouth daily.      . Omega-3 Fatty Acids (FISH OIL) 1200 MG CAPS Take 2 capsules by mouth.  No current facility-administered medications on file prior to visit.    BP 140/78 mmHg  Pulse 119  Temp(Src) 99.4 F (37.4 C) (Oral)  Ht 5' 11.5" (1.816 m)  Wt 354 lb (160.573 kg)  BMI 48.69 kg/m2  SpO2 95%       Objective:   Physical Exam  Constitutional: He is oriented to person, place, and time. He appears well-developed and well-nourished. No distress.  Muffled voice  HENT:  Mouth/Throat: Uvula is midline. Oropharyngeal exudate and posterior oropharyngeal edema present. No tonsillar abscesses.  + whiff test  Cardiovascular: Normal rate, regular rhythm, normal heart sounds and intact distal pulses.  Exam reveals no gallop and no friction rub.   No murmur heard. Pulmonary/Chest: Effort normal and breath sounds normal. No respiratory distress. He has no wheezes. He has no rales. He  exhibits no tenderness.  Musculoskeletal: Normal range of motion.  Lymphadenopathy:       Head (right side): Submental, submandibular and tonsillar adenopathy present.       Head (left side): Submental, submandibular and tonsillar adenopathy present.    He has cervical adenopathy.       Right cervical: Superficial cervical and deep cervical adenopathy present.       Left cervical: Superficial cervical and deep cervical adenopathy present.  Neurological: He is alert and oriented to person, place, and time.  Skin: Skin is warm and dry. No rash noted. He is not diaphoretic. No erythema. No pallor.  Psychiatric: He has a normal mood and affect. His behavior is normal. Judgment and thought content normal.  Nursing note and vitals reviewed.     Assessment & Plan:  1. Sore throat - Strep vs epiglottitis vs mono..  - cefTRIAXone (ROCEPHIN) injection 1 g; Inject 1 g into the muscle daily. - methylPREDNISolone acetate (DEPO-MEDROL) injection 160 mg; Inject 2 mLs (160 mg total) into the muscle once. - POC Mono (Epstein Barr Virus)- Negative - Culture, Group A Strep - POCT rapid strep A- Negative - Going to treat as strep throat despite negative POC - Reviewed with Dr. Clent RidgesFry and he agrees - Advised to go to the ER with difficulty breathing, feeling as though his throat is going to close.

## 2015-06-30 ENCOUNTER — Encounter: Payer: Self-pay | Admitting: Adult Health

## 2015-06-30 ENCOUNTER — Ambulatory Visit (INDEPENDENT_AMBULATORY_CARE_PROVIDER_SITE_OTHER): Payer: Managed Care, Other (non HMO) | Admitting: Adult Health

## 2015-06-30 VITALS — BP 142/76 | HR 98 | Temp 98.6°F | Wt 350.0 lb

## 2015-06-30 DIAGNOSIS — J020 Streptococcal pharyngitis: Secondary | ICD-10-CM | POA: Diagnosis not present

## 2015-06-30 MED ORDER — PREDNISONE 20 MG PO TABS: ORAL_TABLET | ORAL | Status: DC

## 2015-06-30 MED ORDER — AZITHROMYCIN 250 MG PO TABS: ORAL_TABLET | ORAL | Status: DC

## 2015-06-30 NOTE — Progress Notes (Signed)
Subjective:    Patient ID: Tyler Larson, male    DOB: [birth year], 55 y.o.   MRN: [id number]  HPI  55 year old male who presents to the office today for follow up regarding strep throat. I saw him yesterday and he had what appeared to be a pretty bad strep infection. He was given 1 gm Rocephin and 160 mg Depo Medrol IM injection.   Today he reports that he is feeling "much better, I am not back to 100%, but I am feeling better." His voice has improved as well as his throat pain. He is able to swallow liquids without difficulty.     Review of Systems  Constitutional: Negative.   HENT: Positive for sore throat, trouble swallowing and voice change.   Hematological: Positive for adenopathy.  All other systems reviewed and are negative.  Past Medical History  Diagnosis Date  . DJD (degenerative joint disease) of knee   . DJD (degenerative joint disease)     right thumb  . Genital herpes   . ED (erectile dysfunction)   . Allergic rhinitis     Social History   Social History  . Marital Status: Single    Spouse Name: N/A  . Number of Children: 2  . Years of Education: N/A   Occupational History  . 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 Smoking status: Never Smoker   . Smokeless tobacco: Never Used  . Alcohol Use: No  . Drug Use: No  . Sexual Activity: Not on file   Other Topics Concern  . Not on file   Social History Narrative    Past Surgical History  Procedure Laterality Date  . Knee surgery      s/p bilat knee x 5 total  . Rotator cuff repair      s/p right rotator cuff tear  . Shoulder surgery      left shoulder tendon sugery  . Umbilical hernia repair      as infant  . Colonoscopy N/A 01/13/2015    Procedure: COLONOSCOPY;  Surgeon: Beverley FiedlerJay M Pyrtle, MD;  Location: [facility];  Service: Gastroenterology;  Laterality: N/A;    Family History  Problem Relation Age of Onset  . Arthritis Mother   . Hypertension Mother   .  Stroke    . Colon polyps Neg Hx   . Colon cancer Neg Hx     Allergies  Allergen Reactions  . Nsaids Other (See Comments)    brusing    Current Outpatient Prescriptions on File Prior to Visit  Medication Sig Dispense Refill  . acyclovir (ZOVIRAX) 200 MG capsule TAKE ONE CAPSULE BY MOUTH TWICE DAILY 60 capsule 2  . Cyanocobalamin 1000 MCG CAPS Take 1 tablet by mouth daily.     . magic mouthwash w/lidocaine SOLN Take 5 mLs by mouth 3 (three) times daily as needed for mouth pain. 180 mL 0  . metaxalone (SKELAXIN) 800 MG tablet Take 1 tablet (800 mg total) by mouth 2 (two) times daily. 60 tablet 5  . Multiple Vitamin (MULTIVITAMIN) capsule Take 1 capsule by mouth daily.      . Omega-3 Fatty Acids (FISH OIL) 1200 MG CAPS Take 2 capsules by mouth.       No current facility-administered medications on file prior to visit.    BP 142/76 mmHg  Pulse 98  Temp(Src) 98.6 F (37 C) (Oral)  Wt 350 lb (158.759 kg)  SpO2 97%  Objective:   Physical Exam  Constitutional: He is oriented to person, place, and time. He appears well-developed and well-nourished. No distress.  HENT:  Mouth/Throat: No oropharyngeal exudate.  Continues to have swelling and redness to back of throat but it is much improved since yesterday.   Neck: Normal range of motion. Neck supple.  Cardiovascular: Normal rate, regular rhythm, normal heart sounds and intact distal pulses.   Pulmonary/Chest: Effort normal and breath sounds normal. No respiratory distress. He has no wheezes. He has no rales. He exhibits no tenderness.  Lymphadenopathy:    He has cervical adenopathy.  Neurological: He is alert and oriented to person, place, and time.  Skin: Skin is warm and dry. No rash noted. He is not diaphoretic. No erythema. No pallor.  Psychiatric: He has a normal mood and affect. His behavior is normal. Judgment and thought content normal.  Nursing note and vitals reviewed.     Assessment & Plan:  1. Streptococcal  sore throat - predniSONE (DELTASONE) 20 MG tablet;  x 4days,20mg x 4 days  Dispense: 12 tablet; Refill: 0 - azithromycin (ZITHROMAX Z-PAK) 250 MG tablet; Take 2 tablets on Day 1.  Then take 1 tablet daily.  Dispense: 6 tablet; Refill: 0 - Follow up if no continued improvement.  - Go to the ER with SOB, inability to breath, or feel as though your throat is going to close.

## 2015-06-30 NOTE — Progress Notes (Signed)
Pre visit review using our clinic review tool, if applicable. No additional management support is needed unless otherwise documented below in the visit note. 

## 2015-06-30 NOTE — Patient Instructions (Signed)
I am very happy that you are feeling better.   I have sent in a prescription for Prednisone, take as directed  40 mg x 4 days 20 mg x 4days  Take the Azithromycin as directed.   Follow up if you do not continue to improve.

## 2015-07-01 LAB — CULTURE, GROUP A STREP

## 2015-08-03 ENCOUNTER — Telehealth: Payer: Self-pay | Admitting: Internal Medicine

## 2015-08-03 NOTE — Telephone Encounter (Signed)
Please advise 

## 2015-08-03 NOTE — Telephone Encounter (Signed)
Ok with me 

## 2015-08-03 NOTE — Telephone Encounter (Signed)
Pt would like to transfer to [facility] due to our office is closer. Can I sch?

## 2015-08-04 NOTE — Telephone Encounter (Signed)
Pt has been scheduled.  °

## 2015-08-04 NOTE — Telephone Encounter (Signed)
lmom for pt to call back

## 2015-08-04 NOTE — Telephone Encounter (Signed)
Please schedule patient.

## 2015-09-15 ENCOUNTER — Other Ambulatory Visit: Payer: Self-pay | Admitting: Internal Medicine

## 2015-11-05 ENCOUNTER — Ambulatory Visit (INDEPENDENT_AMBULATORY_CARE_PROVIDER_SITE_OTHER): Payer: Managed Care, Other (non HMO) | Admitting: Adult Health

## 2015-11-05 ENCOUNTER — Encounter: Payer: Self-pay | Admitting: Adult Health

## 2015-11-05 VITALS — BP 118/70 | Temp 99.2°F | Ht 71.5 in | Wt 369.3 lb

## 2015-11-05 DIAGNOSIS — R351 Nocturia: Secondary | ICD-10-CM

## 2015-11-05 DIAGNOSIS — Z7189 Other specified counseling: Secondary | ICD-10-CM | POA: Diagnosis not present

## 2015-11-05 DIAGNOSIS — Z7689 Persons encountering health services in other specified circumstances: Secondary | ICD-10-CM

## 2015-11-05 MED ORDER — TAMSULOSIN HCL 0.4 MG PO CAPS: 0.4000 mg | ORAL_CAPSULE | Freq: Every day | ORAL | 1 refills | Status: DC

## 2015-11-05 NOTE — Patient Instructions (Signed)
It was great seeing you again!  Please follow up with you for your physical and at that time we will discuss medications for weight loss  If you need anything before that please let me know

## 2015-11-05 NOTE — Progress Notes (Signed)
Patient presents to clinic today to establish care. This is a pleasant 55 year old male who  has a past medical history of Allergic rhinitis; DJD (degenerative joint disease); DJD (degenerative joint disease) of knee; ED (erectile dysfunction); and Genital herpes.  08/26/2014 with MD John   Acute Concerns: Establish Care   Chronic Issues: Nocturia - He is getting up at night 2-3 times to urinate. He feels as though this is becoming more of an issue as the times goes on and this has been happening for close to a year. Denies any UTI type symptoms.   Obesity - He has a gym at home but is not exercising regularly. He is also not eating healthy on a consistent basis. He would like to lose weight and knows that he needs to for his over all health. He is having increasing pain in his knees and shoulders   Health Maintenance: Dental --has not seen  Vision --Yearly  Immunizations -- UTD  Colonoscopy -- 2016 - 10 year plan   Diet: Tries to eat healthy  Exercise: Does not exercise regularly  He is not followed by any specialties   Past Medical History:  Diagnosis Date  . Allergic rhinitis   . DJD (degenerative joint disease)    right thumb  . DJD (degenerative joint disease) of knee   . ED (erectile dysfunction)   . Genital herpes     Past Surgical History:  Procedure Laterality Date  . COLONOSCOPY N/A 01/13/2015   Procedure: COLONOSCOPY;  Surgeon: Beverley Fiedler, MD;  Location: [facility];  Service: Gastroenterology;  Laterality: N/A;  . KNEE SURGERY     s/p bilat knee x 5 total  . ROTATOR CUFF REPAIR     s/p right rotator cuff tear  . SHOULDER SURGERY     left shoulder tendon sugery  . UMBILICAL HERNIA REPAIR     as infant    Current Outpatient Prescriptions on File Prior to Visit  Medication Sig Dispense Refill  . acyclovir (ZOVIRAX) 200 MG capsule TAKE ONE CAPSULE BY MOUTH TWICE DAILY 60 capsule 0   No current facility-administered medications on file prior to  visit.     Allergies  Allergen Reactions  . Nsaids Other (See Comments)    brusing    Family History  Problem Relation Age of Onset  . Arthritis Mother   . Hypertension Mother   . Stroke    . Colon polyps Neg Hx   . Colon cancer Neg Hx     Social History   Social History  . Marital status: Single    Spouse name: N/A  . Number of children: 2  . Years of education: N/A   Occupational History  . 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 Smoking status: Never Smoker  . Smokeless tobacco: Never Used  . Alcohol use No  . Drug use: No  . Sexual activity: Not on file   Other Topics Concern  . Not on file   Social History Narrative  . No narrative on file    Review of Systems  Constitutional: Negative.   Respiratory: Negative.   Cardiovascular: Negative.   Genitourinary: Positive for frequency and urgency.       Nocturia   Musculoskeletal: Positive for joint pain (bilateral knee and right shoulder).  Skin: Negative.   Neurological: Negative.   Psychiatric/Behavioral: Negative.   All other systems reviewed and are negative.   BP  118/70 (BP Location: Left Arm, Patient Position: Sitting)   Temp 99.2 F (37.3 C) (Oral)   Ht 5' 11.5" (1.816 m)   Wt (!) 369 lb 4.8 oz (167.5 kg)   BMI 50.79 kg/m   Physical Exam  Constitutional: He is oriented to person, place, and time and well-developed, well-nourished, and in no distress. No distress.  obese  Cardiovascular: Normal rate, regular rhythm, normal heart sounds and intact distal pulses.  Exam reveals no gallop and no friction rub.   No murmur heard. Pulmonary/Chest: Effort normal and breath sounds normal. No respiratory distress. He has no wheezes. He has no rales. He exhibits no tenderness.  Musculoskeletal: Normal range of motion. He exhibits no edema, tenderness or deformity.  Neurological: He is alert and oriented to person, place, and time. He has normal reflexes. Gait normal.  GCS score is 15.  Skin: Skin is warm and dry. No rash noted. He is not diaphoretic. No erythema. No pallor.  Psychiatric: Mood, memory, affect and judgment normal.  Nursing note and vitals reviewed.  Assessment/Plan: 1. Encounter to establish care - Follow up for CPE - Work on a healthy diet and exercising daily.   2. Nocturia - Likely LUTS related to BPH  - tamsulosin (FLOMAX) 0.4 MG CAPS capsule; Take 1 capsule (0.4 mg total) by mouth daily.  Dispense: 90 capsule; Refill: 1  3. Morbid obesity due to excess calories (HCC) - He is going to work on diet and exercise. We will follow up at his physical and at that time if he is dieting and exercising then we will talk about starting Belviq or phentermine.   Shirline Frees, NP

## 2016-02-05 ENCOUNTER — Encounter: Payer: Managed Care, Other (non HMO) | Admitting: Adult Health

## 2016-05-19 ENCOUNTER — Encounter: Payer: Self-pay | Admitting: Adult Health

## 2016-05-19 ENCOUNTER — Other Ambulatory Visit (HOSPITAL_COMMUNITY)
Admission: RE | Admit: 2016-05-19 | Discharge: 2016-05-19 | Disposition: A | Discharge status: Home / Self Care | Payer: Self-pay | Source: Ambulatory Visit | Attending: Adult Health | Admitting: Adult Health

## 2016-05-19 ENCOUNTER — Ambulatory Visit (INDEPENDENT_AMBULATORY_CARE_PROVIDER_SITE_OTHER): Payer: Self-pay | Admitting: Adult Health

## 2016-05-19 VITALS — BP 122/76 | Temp 98.1°F | Ht 71.5 in | Wt 373.2 lb

## 2016-05-19 DIAGNOSIS — Z113 Encounter for screening for infections with a predominantly sexual mode of transmission: Secondary | ICD-10-CM

## 2016-05-19 DIAGNOSIS — Z0000 Encounter for general adult medical examination without abnormal findings: Secondary | ICD-10-CM

## 2016-05-19 LAB — POC URINALSYSI DIPSTICK (AUTOMATED)
Bilirubin, UA: NEGATIVE
Glucose, UA: NEGATIVE
Ketones, UA: NEGATIVE
LEUKOCYTES UA: NEGATIVE
NITRITE UA: NEGATIVE
PH UA: 5.5
PROTEIN UA: NEGATIVE
RBC UA: NEGATIVE
Spec Grav, UA: 1.025
Urobilinogen, UA: 0.2

## 2016-05-19 LAB — CBC WITH DIFFERENTIAL/PLATELET
BASOS ABS: 0 10*3/uL (ref 0.0–0.1)
Basophils Relative: 0.3 % (ref 0.0–3.0)
Eosinophils Absolute: 0.1 10*3/uL (ref 0.0–0.7)
Eosinophils Relative: 0.9 % (ref 0.0–5.0)
HEMATOCRIT: 45.6 % (ref 39.0–52.0)
HEMOGLOBIN: 14.8 g/dL (ref 13.0–17.0)
LYMPHS PCT: 21.1 % (ref 12.0–46.0)
Lymphs Abs: 1.8 10*3/uL (ref 0.7–4.0)
MCHC: 32.5 g/dL (ref 30.0–36.0)
MCV: 83.8 fl (ref 78.0–100.0)
MONOS PCT: 7.8 % (ref 3.0–12.0)
Monocytes Absolute: 0.7 10*3/uL (ref 0.1–1.0)
NEUTROS ABS: 6 10*3/uL (ref 1.4–7.7)
Neutrophils Relative %: 69.9 % (ref 43.0–77.0)
Platelets: 220 10*3/uL (ref 150.0–400.0)
RBC: 5.44 Mil/uL (ref 4.22–5.81)
RDW: 15.3 % (ref 11.5–15.5)
WBC: 8.5 10*3/uL (ref 4.0–10.5)

## 2016-05-19 LAB — HEPATIC FUNCTION PANEL
ALBUMIN: 3.8 g/dL (ref 3.5–5.2)
ALK PHOS: 47 U/L (ref 39–117)
ALT: 15 U/L (ref 0–53)
AST: 14 U/L (ref 0–37)
Bilirubin, Direct: 0.1 mg/dL (ref 0.0–0.3)
TOTAL PROTEIN: 7 g/dL (ref 6.0–8.3)
Total Bilirubin: 0.4 mg/dL (ref 0.2–1.2)

## 2016-05-19 LAB — BASIC METABOLIC PANEL
BUN: 19 mg/dL (ref 6–23)
CALCIUM: 8.6 mg/dL (ref 8.4–10.5)
CO2: 30 meq/L (ref 19–32)
Chloride: 107 mEq/L (ref 96–112)
Creatinine, Ser: 1.07 mg/dL (ref 0.40–1.50)
GFR: 91.81 mL/min (ref >60.00)
Glucose, Bld: 104 mg/dL — ABNORMAL HIGH (ref 70–99)
Potassium: 4.5 mEq/L (ref 3.5–5.1)
SODIUM: 143 meq/L (ref 135–145)

## 2016-05-19 LAB — LIPID PANEL
CHOL/HDL RATIO: 3
Cholesterol: 131 mg/dL (ref 0–200)
HDL: 38.8 mg/dL — ABNORMAL LOW (ref >39.00)
LDL Cholesterol: 81 mg/dL (ref 0–99)
NONHDL: 92.66
Triglycerides: 60 mg/dL (ref 0.0–149.0)
VLDL: 12 mg/dL (ref 0.0–40.0)

## 2016-05-19 LAB — PSA: PSA: 0.73 ng/mL (ref 0.10–4.00)

## 2016-05-19 LAB — TSH: TSH: 1.11 u[IU]/mL (ref 0.35–4.50)

## 2016-05-19 MED ORDER — BUPROPION HCL ER (SR) 150 MG PO TB12: 150.0000 mg | ORAL_TABLET | Freq: Two times a day (BID) | ORAL | 1 refills | Status: DC

## 2016-05-19 NOTE — Patient Instructions (Addendum)
It was great seeing you today   I have sent in a prescription for Wellbutrin to help with weight loss. Please follow up with me in 3 months for a recheck. Continue to stay active and exercise. Take as directed: 150 mg once daily in the morning; increase to 150 mg twice daily after 3 days;  Health Maintenance, Male A healthy lifestyle and preventative care can promote health and wellness.  Maintain regular health, dental, and eye exams.  Eat a healthy diet. Foods like vegetables, fruits, whole grains, low-fat dairy products, and lean protein foods contain the nutrients you need and are low in calories. Decrease your intake of foods high in solid fats, added sugars, and salt. Get information about a proper diet from your health care provider, if necessary.  Regular physical exercise is one of the most important things you can do for your health. Most adults should get at least 150 minutes of moderate-intensity exercise (any activity that increases your heart rate and causes you to sweat) each week. In addition, most adults need muscle-strengthening exercises on 2 or more days a week.   Maintain a healthy weight. The body mass index (BMI) is a screening tool to identify possible weight problems. It provides an estimate of body fat based on height and weight. Your health care provider can find your BMI and can help you achieve or maintain a healthy weight. For males 20 years and older:  A BMI below 18.5 is considered underweight.  A BMI of 18.5 to 24.9 is normal.  A BMI of 25 to 29.9 is considered overweight.  A BMI of 30 and above is considered obese.  Maintain normal blood lipids and cholesterol by exercising and minimizing your intake of saturated fat. Eat a balanced diet with plenty of fruits and vegetables. Blood tests for lipids and cholesterol should begin at age 57 and be repeated every 5 years. If your lipid or cholesterol levels are high, you are over age 57, or you are at high risk for  heart disease, you may need your cholesterol levels checked more frequently.Ongoing high lipid and cholesterol levels should be treated with medicines if diet and exercise are not working.  If you smoke, find out from your health care provider how to quit. If you do not use tobacco, do not start.  Lung cancer screening is recommended for adults aged 55-80 years who are at high risk for developing lung cancer because of a history of smoking. A yearly low-dose CT scan of the lungs is recommended for people who have at least a 30-pack-year history of smoking and are current smokers or have quit within the past 15 years. A pack year of smoking is smoking an average of 1 pack of cigarettes a day for 1 year (for example, a 30-pack-year history of smoking could mean smoking 1 pack a day for 30 years or 2 packs a day for 15 years). Yearly screening should continue until the smoker has stopped smoking for at least 15 years. Yearly screening should be stopped for people who develop a health problem that would prevent them from having lung cancer treatment.  If you choose to drink alcohol, do not have more than 2 drinks per day. One drink is considered to be 12 oz (360 mL) of beer, 5 oz (150 mL) of wine, or 1.5 oz (45 mL) of liquor.  Avoid the use of street drugs. Do not share needles with anyone. Ask for help if you need support or instructions  about stopping the use of drugs.  High blood pressure causes heart disease and increases the risk of stroke. High blood pressure is more likely to develop in:  People who have blood pressure in the end of the normal range (100-139/85-89 mm Hg).  People who are overweight or obese.  People who are African American.  If you are 77-1 years of age, have your blood pressure checked every 3-5 years. If you are 6 years of age or older, have your blood pressure checked every year. You should have your blood pressure measured twice--once when you are at a hospital or  clinic, and once when you are not at a hospital or clinic. Record the average of the two measurements. To check your blood pressure when you are not at a hospital or clinic, you can use:  An automated blood pressure machine at a pharmacy.  A home blood pressure monitor.  If you are 72-85 years old, ask your health care provider if you should take aspirin to prevent heart disease.  Diabetes screening involves taking a blood sample to check your fasting blood sugar level. This should be done once every 3 years after age 66 if you are at a normal weight and without risk factors for diabetes. Testing should be considered at a younger age or be carried out more frequently if you are overweight and have at least 1 risk factor for diabetes.  Colorectal cancer can be detected and often prevented. Most routine colorectal cancer screening begins at the age of 2 and continues through age 31. However, your health care provider may recommend screening at an earlier age if you have risk factors for colon cancer. On a yearly basis, your health care provider may provide home test kits to check for hidden blood in the stool. A small camera at the end of a tube may be used to directly examine the colon (sigmoidoscopy or colonoscopy) to detect the earliest forms of colorectal cancer. Talk to your health care provider about this at age 13 when routine screening begins. A direct exam of the colon should be repeated every 5-10 years through age 73, unless early forms of precancerous polyps or small growths are found.  People who are at an increased risk for hepatitis B should be screened for this virus. You are considered at high risk for hepatitis B if:  You were born in a country where hepatitis B occurs often. Talk with your health care provider about which countries are considered high risk.  Your parents were born in a high-risk country and you have not received a shot to protect against hepatitis B (hepatitis B  vaccine).  You have HIV or AIDS.  You use needles to inject street drugs.  You live with, or have sex with, someone who has hepatitis B.  You are a man who has sex with other men (MSM).  You get hemodialysis treatment.  You take certain medicines for conditions like cancer, organ transplantation, and autoimmune conditions.  Hepatitis C blood testing is recommended for all people born from [birth year] through 1966 and any individual with known risk factors for hepatitis C.  Healthy men should no longer receive prostate-specific antigen (PSA) blood tests as part of routine cancer screening. Talk to your health care provider about prostate cancer screening.  Testicular cancer screening is not recommended for adolescents or adult males who have no symptoms. Screening includes self-exam, a health care provider exam, and other screening tests. Consult with your health care provider  about any symptoms you have or any concerns you have about testicular cancer.  Practice safe sex. Use condoms and avoid high-risk sexual practices to reduce the spread of sexually transmitted infections (STIs).  You should be screened for STIs, including gonorrhea and chlamydia if:  You are sexually active and are younger than 24 years.  You are older than 24 years, and your health care provider tells you that you are at risk for this type of infection.  Your sexual activity has changed since you were last screened, and you are at an increased risk for chlamydia or gonorrhea. Ask your health care provider if you are at risk.  If you are at risk of being infected with HIV, it is recommended that you take a prescription medicine daily to prevent HIV infection. This is called pre-exposure prophylaxis (PrEP). You are considered at risk if:  You are a man who has sex with other men (MSM).  You are a heterosexual man who is sexually active with multiple partners.  You take drugs by injection.  You are sexually active  with a partner who has HIV.  Talk with your health care provider about whether you are at high risk of being infected with HIV. If you choose to begin PrEP, you should first be tested for HIV. You should then be tested every 3 months for as long as you are taking PrEP.  Use sunscreen. Apply sunscreen liberally and repeatedly throughout the day. You should seek shade when your shadow is shorter than you. Protect yourself by wearing long sleeves, pants, a wide-brimmed hat, and sunglasses year round whenever you are outdoors.  Tell your health care provider of new moles or changes in moles, especially if there is a change in shape or color. Also, tell your health care provider if a mole is larger than the size of a pencil eraser.  A one-time screening for abdominal aortic aneurysm (AAA) and surgical repair of large AAAs by ultrasound is recommended for men aged 70-75 years who are current or former smokers.  Stay current with your vaccines (immunizations).   This information is not intended to replace advice given to you by your health care provider. Make sure you discuss any questions you have with your health care provider.   Document Released: 09/24/2007 Document Revised: 04/18/2014 Document Reviewed: 08/23/2010 Elsevier Interactive Patient Education [employer].

## 2016-05-19 NOTE — Progress Notes (Signed)
Subjective:    Patient ID: Tyler Larson, male    DOB: [birth year], 56 y.o.   MRN: [id number]  HPI  Patient presents for yearly preventative medicine examination. He is a pleasant 56 year old male who  has a past medical history of Allergic rhinitis; DJD (degenerative joint disease); DJD (degenerative joint disease) of knee; ED (erectile dysfunction); Genital herpes; Insomnia; and Seasonal allergies.   All immunizations and health maintenance protocols were reviewed with the patient and needed orders were placed.  Appropriate screening laboratory values were ordered for the patient including screening of hyperlipidemia, renal function and hepatic function. If indicated by BPH, a PSA was ordered.  Medication reconciliation,  past medical history, social history, problem list and allergies were reviewed in detail with the patient  Goals were established with regard to weight loss, exercise, and  diet in compliance with medications. He is walking while on campus but has not been exercising. He has changed his diet. He reports suffering from some depression after his mother diet and was having a hard time getting out of bed. He does not currently feel depressed  He is up to date on his colonscopy. He has seen been eye doctor this year. He has not been seen by his dentist.   He would like to be checked for STDs because a girl he was having intimate relations with told him that she tested positive for Trichomonas.   Review of Systems  Constitutional: Negative.   HENT: Negative.   Eyes: Negative.   Respiratory: Negative.   Cardiovascular: Negative.   Gastrointestinal: Negative.   Endocrine: Negative.   Genitourinary: Negative.        Nocturia   Musculoskeletal: Negative.   Skin: Negative.   Allergic/Immunologic: Negative.   Neurological: Negative.   Hematological: Negative.   Psychiatric/Behavioral: Negative.   All other systems reviewed and are negative.  Past Medical History:    Diagnosis Date  . Allergic rhinitis   . DJD (degenerative joint disease)    right thumb  . DJD (degenerative joint disease) of knee   . ED (erectile dysfunction)   . Genital herpes   . Insomnia   . Seasonal allergies     Social History   Social History  . Marital status: Single    Spouse name: N/A  . Number of children: 2  . Years of education: N/A   Occupational History  . 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 Smoking status: Never Smoker  . Smokeless tobacco: Never Used  . Alcohol use No  . Drug use: No  . Sexual activity: Not on file   Other Topics Concern  . Not on file   Social History Narrative   He is not working    He is going back to school this fall for RT       Past Surgical History:  Procedure Laterality Date  . COLONOSCOPY N/A 01/13/2015   Procedure: COLONOSCOPY;  Surgeon: Beverley Fiedler, MD;  Location: [facility];  Service: Gastroenterology;  Laterality: N/A;  . KNEE SURGERY     s/p bilat knee x 5 total  . ROTATOR CUFF REPAIR     s/p right rotator cuff tear  . SHOULDER SURGERY     left shoulder tendon sugery  . UMBILICAL HERNIA REPAIR     as infant    Family History  Problem Relation Age of Onset  . Arthritis Mother   . Hypertension Mother   .  Colon polyps Neg Hx   . Colon cancer Neg Hx     Allergies  Allergen Reactions  . Nsaids Other (See Comments)    brusing    Current Outpatient Prescriptions on File Prior to Visit  Medication Sig Dispense Refill  . acyclovir (ZOVIRAX) 200 MG capsule TAKE ONE CAPSULE BY MOUTH TWICE DAILY 60 capsule 0  . tamsulosin (FLOMAX) 0.4 MG CAPS capsule Take 1 capsule (0.4 mg total) by mouth daily. 90 capsule 1   No current facility-administered medications on file prior to visit.     BP 122/76   Temp 98.1 F (36.7 C) (Oral)   Ht 5' 11.5" (1.816 m)   Wt (!) 373 lb 3.2 oz (169.3 kg)   BMI 51.33 kg/m   Wt Readings from Last 3 Encounters:  05/19/16 (!) 373 lb  3.2 oz (169.3 kg)  11/05/15 (!) 369 lb 4.8 oz (167.5 kg)  06/30/15 (!) 350 lb (158.8 kg)       Objective:   Physical Exam  Constitutional: He is oriented to person, place, and time. He appears well-developed and well-nourished. No distress.  Morbidly obese  HENT:  Head: Normocephalic and atraumatic.  Right Ear: Hearing, tympanic membrane, external ear and ear canal normal.  Left Ear: Hearing, tympanic membrane, external ear and ear canal normal.  Nose: Nose normal.  Mouth/Throat: Oropharynx is clear and moist. No oropharyngeal exudate.  Eyes: Conjunctivae and EOM are normal. Pupils are equal, round, and reactive to light. Right eye exhibits no discharge. Left eye exhibits no discharge. No scleral icterus.  Neck: Normal range of motion. Neck supple. No JVD present. No tracheal tenderness present. Carotid bruit is not present. No tracheal deviation present. No thyroid mass and no thyromegaly present.  Cardiovascular: Normal rate, regular rhythm, normal heart sounds and intact distal pulses.  Exam reveals no gallop and no friction rub.   No murmur heard. Pulmonary/Chest: Effort normal and breath sounds normal. No stridor. No respiratory distress. He has no wheezes. He has no rales. He exhibits no tenderness.  Abdominal: Soft. Bowel sounds are normal. He exhibits no distension and no mass. There is no tenderness. There is no rebound and no guarding.  Genitourinary: Rectum normal and prostate normal. Rectal exam shows guaiac negative stool.  Musculoskeletal: Normal range of motion. He exhibits no edema, tenderness or deformity.  Lymphadenopathy:    He has no cervical adenopathy.  Neurological: He is alert and oriented to person, place, and time. He has normal reflexes. He displays normal reflexes. No cranial nerve deficit. He exhibits normal muscle tone. Coordination normal.  Skin: Skin is warm and dry. No rash noted. He is not diaphoretic. No erythema. No pallor.  Psychiatric: He has a normal  mood and affect. His behavior is normal. Judgment and thought content normal.  Nursing note and vitals reviewed.     Assessment & Plan:  1. Routine general medical examination at a health care facility  - Basic metabolic panel - CBC with Differential/Platelet - Hepatic function panel - Lipid panel - POCT Urinalysis Dipstick (Automated) - PSA - TSH - HIV antibody - Urine cytology ancillary only - Follow up in one year or sooner if needed 2. Screen for STD (sexually transmitted disease)  - HIV antibody - Urine cytology ancillary only  3. Morbid obesity (HCC)  - Basic metabolic panel - CBC with Differential/Platelet - Hepatic function panel - Lipid panel - POCT Urinalysis Dipstick (Automated) - buPROPion (WELLBUTRIN SR) 150 MG 12 hr tablet; Take 1 tablet (150  mg total) by mouth 2 (two) times daily.  Dispense: 180 tablet; Refill: 1 - Increase aerobic exercise. Continue to work on diet.  - Follow up in 3 months or sooner if needed  Shirline Frees, NP

## 2016-05-20 LAB — URINE CYTOLOGY ANCILLARY ONLY
Chlamydia: NEGATIVE
NEISSERIA GONORRHEA: NEGATIVE
TRICH (WINDOWPATH): NEGATIVE

## 2016-05-20 LAB — HIV ANTIBODY (ROUTINE TESTING W REFLEX): HIV 1&2 Ab, 4th Generation: NONREACTIVE

## 2016-05-30 ENCOUNTER — Encounter (HOSPITAL_COMMUNITY): Payer: Self-pay | Admitting: Emergency Medicine

## 2016-05-30 ENCOUNTER — Emergency Department (HOSPITAL_COMMUNITY)
Admission: EM | Admit: 2016-05-30 | Discharge: 2016-05-30 | Disposition: A | Discharge status: Home / Self Care | Payer: Self-pay | Attending: Emergency Medicine | Admitting: Emergency Medicine

## 2016-05-30 DIAGNOSIS — R109 Unspecified abdominal pain: Secondary | ICD-10-CM | POA: Insufficient documentation

## 2016-05-30 LAB — URINALYSIS, ROUTINE W REFLEX MICROSCOPIC
BACTERIA UA: NONE SEEN
BILIRUBIN URINE: NEGATIVE
Glucose, UA: NEGATIVE mg/dL
Ketones, ur: NEGATIVE mg/dL
Leukocytes, UA: NEGATIVE
Nitrite: NEGATIVE
PROTEIN: 30 mg/dL — AB
SPECIFIC GRAVITY, URINE: 1.009 (ref 1.005–1.030)
SQUAMOUS EPITHELIAL / LPF: NONE SEEN
pH: 5 (ref 5.0–8.0)

## 2016-05-30 LAB — I-STAT CHEM 8, ED
BUN: 23 mg/dL — AB (ref 6–20)
CHLORIDE: 107 mmol/L (ref 101–111)
Calcium, Ion: 1.04 mmol/L — ABNORMAL LOW (ref 1.15–1.40)
Creatinine, Ser: 1.3 mg/dL — ABNORMAL HIGH (ref 0.61–1.24)
Glucose, Bld: 92 mg/dL (ref 65–99)
HEMATOCRIT: 47 % (ref 39.0–52.0)
Hemoglobin: 16 g/dL (ref 13.0–17.0)
Potassium: 3.7 mmol/L (ref 3.5–5.1)
SODIUM: 143 mmol/L (ref 135–145)
TCO2: 24 mmol/L (ref 0–100)

## 2016-05-30 MED ORDER — HYDROCODONE-ACETAMINOPHEN 5-325 MG PO TABS: 1.0000 | ORAL_TABLET | ORAL | 0 refills | Status: DC | PRN

## 2016-05-30 MED ORDER — ONDANSETRON 4 MG PO TBDP: 4.0000 mg | ORAL_TABLET | Freq: Three times a day (TID) | ORAL | 0 refills | Status: DC | PRN

## 2016-05-30 MED ORDER — TAMSULOSIN HCL 0.4 MG PO CAPS
0.4000 mg | ORAL_CAPSULE | Freq: Every day | ORAL | 0 refills | Status: DC
Start: 2016-05-30 — End: 2019-07-25

## 2016-05-30 MED ORDER — SODIUM CHLORIDE 0.9 % IV BOLUS (SEPSIS): 1000.0000 mL | Freq: Once | INTRAVENOUS | Status: DC

## 2016-05-30 NOTE — Discharge Instructions (Signed)
You have been seen today for flank pain. There is evidence of a possible kidney stone, based on your symptoms and some of your lab results. You have declined the CT scan for confirmation, however, you should still follow up with urology as soon as possible. Call the number provided to set up an appointment. Note that it can take up to 30 days to pass a kidney stone. This is why it is important to have close follow-up with a urologist so that they may manage any further pain management or other issues.   In the mean time, urinate into the mesh basket to catch any kidney stone that may pass.  Be sure to stay well-hydrated. Drink at least eight 8oz glasses of water a day.   May use ibuprofen or Tylenol for pain management. Vicodin for severe pain. Do not drive or perform other dangerous activities while taking the Vicodin.  The tamsulosin (Flomax) is designed to help the stone pass. Take this medication daily. Zofran for nausea/vomiting.  Return to the ED should you develop fever, difficulty urinating, uncontrolled pain, uncontrolled vomiting, or any other additional concerns.

## 2016-05-30 NOTE — ED Triage Notes (Signed)
Pt woke up 0400 with a severe pain in his left side that caused nausea and vomiting. Has vomited 4 times. No urinary symptoms

## 2016-05-30 NOTE — ED Provider Notes (Signed)
[facility]-EMERGENCY DEPT Provider Note   CSN: [id number] Arrival date & time: 05/30/16  1478     History   Chief Complaint Chief Complaint  Patient presents with  . Flank Pain    HPI Tyler Larson is a 56 y.o. male.  HPI   Tyler Larson is a 56 y.o. male, patient with no pertinent past medical history, presenting to the ED with right flank pain that came on suddenly at 4 AM this morning. The pain woke him from a sleep. Pain was sharp, 10/10, radiating inferiorly down the right flank. Sudden onset of vomiting with the pain. 4 episodes of vomiting. Pt had a BM this morning thinking that perhaps that was the cause of the pain, but the pain continued to worsen even after the BM. Pt states pain has since resolved. Denies fever/chills, abdominal pain, current nausea, diarrhea, urinary complaints, or any other complaints.      Past Medical History:  Diagnosis Date  . Allergic rhinitis   . DJD (degenerative joint disease)    right thumb  . DJD (degenerative joint disease) of knee   . ED (erectile dysfunction)   . Genital herpes   . Insomnia   . Seasonal allergies     Patient Active Problem List   Diagnosis Date Noted  . Urinary frequency 08/26/2014  . Non-compliant behavior 08/26/2013  . Nocturia 06/07/2013  . Right shoulder pain 06/07/2013  . Preventative health care 09/19/2010  . INSOMNIA-SLEEP DISORDER-UNSPEC 09/09/2009  . ALLERGIC RHINITIS 07/23/2008  . ERECTILE DYSFUNCTION 05/12/2008  . OSTEOARTHRITIS, KNEES, BILATERAL 01/18/2008  . HSV 06/22/2007    Past Surgical History:  Procedure Laterality Date  . COLONOSCOPY N/A 01/13/2015   Procedure: COLONOSCOPY;  Surgeon: Beverley Fiedler, MD;  Location: [facility];  Service: Gastroenterology;  Laterality: N/A;  . KNEE SURGERY     s/p bilat knee x 5 total  . ROTATOR CUFF REPAIR     s/p right rotator cuff tear  . SHOULDER SURGERY     left shoulder tendon sugery  . UMBILICAL HERNIA REPAIR     as infant       Home  Medications    Prior to Admission medications   Medication Sig Start Date End Date Taking? Authorizing Provider  acyclovir (ZOVIRAX) 200 MG capsule TAKE ONE CAPSULE BY MOUTH TWICE DAILY 09/16/15  Yes Corwin Levins, MD  HYDROcodone-acetaminophen (NORCO/VICODIN) 5-325 MG tablet Take 1-2 tablets by mouth every 4 (four) hours as needed. 05/30/16   Algie Westry C Rc Amison, PA-C  ondansetron (ZOFRAN ODT) 4 MG disintegrating tablet Take 1 tablet (4 mg total) by mouth every 8 (eight) hours as needed for nausea or vomiting. 05/30/16   Haralambos Yeatts C Kierstyn Baranowski, PA-C  tamsulosin (FLOMAX) 0.4 MG CAPS capsule Take 1 capsule (0.4 mg total) by mouth daily. 05/30/16   Anselm Pancoast, PA-C    Family History Family History  Problem Relation Age of Onset  . Arthritis Mother   . Hypertension Mother   . Colon polyps Neg Hx   . Colon cancer Neg Hx     Social History Social History  Substance Use Topics  . Smoking status: Never Smoker  . Smokeless tobacco: Never Used  . Alcohol use No     Allergies   Nsaids   Review of Systems Review of Systems  Constitutional: Negative for chills and fever.  Respiratory: Negative for shortness of breath.   Cardiovascular: Negative for chest pain.  Gastrointestinal: Positive for nausea (resolved) and vomiting (resolved). Negative for  abdominal pain, blood in stool, constipation and diarrhea.  Genitourinary: Positive for flank pain (right, resolved).  Musculoskeletal: Negative for back pain.  All other systems reviewed and are negative.    Physical Exam Updated Vital Signs BP 139/64 (BP Location: Right Arm)   Pulse 69   Temp 98.1 F (36.7 C) (Oral)   Resp 16   SpO2 100%   Physical Exam  Constitutional: He appears well-developed and well-nourished. No distress.  HENT:  Head: Normocephalic and atraumatic.  Eyes: Conjunctivae are normal.  Neck: Neck supple.  Cardiovascular: Normal rate, regular rhythm, normal heart sounds and intact distal pulses.   Pulmonary/Chest: Effort normal and  breath sounds normal. No respiratory distress.  Abdominal: Soft. There is no tenderness. There is no guarding and no CVA tenderness.  Musculoskeletal: He exhibits no edema.  Lymphadenopathy:    He has no cervical adenopathy.  Neurological: He is alert.  Skin: Skin is warm and dry. He is not diaphoretic.  Psychiatric: He has a normal mood and affect. His behavior is normal.  Nursing note and vitals reviewed.    ED Treatments / Results  Labs (all labs ordered are listed, but only abnormal results are displayed) Labs Reviewed  URINALYSIS, ROUTINE W REFLEX MICROSCOPIC - Abnormal; Notable for the following:       Result Value   Hgb urine dipstick MODERATE (*)    Protein, ur 30 (*)    All other components within normal limits  I-STAT CHEM 8, ED - Abnormal; Notable for the following:    BUN 23 (*)    Creatinine, Ser 1.30 (*)    Calcium, Ion 1.04 (*)    All other components within normal limits  COMPREHENSIVE METABOLIC PANEL  CBC WITH DIFFERENTIAL/PLATELET    BUN  Date Value Ref Range Status  05/30/2016 23 (H) 6 - 20 mg/dL Final  16/01/9603 19 6 - 23 mg/dL Final  54/12/8117 15 6 - 23 mg/dL Final  14/78/2956 15 6 - 23 mg/dL Final   Creatinine, Ser  Date Value Ref Range Status  05/30/2016 1.30 (H) 0.61 - 1.24 mg/dL Final  21/30/8657 8.46 0.40 - 1.50 mg/dL Final  96/29/5284 1.32 0.40 - 1.50 mg/dL Final  44/04/270 1.0 0.4 - 1.5 mg/dL Final     EKG  EKG Interpretation None       Radiology No results found.  Procedures Procedures (including critical care time)  Medications Ordered in ED Medications  sodium chloride 0.9 % bolus 1,000 mL (1,000 mLs Intravenous Refused 05/30/16 0848)     Initial Impression / Assessment and Plan / ED Course  I have reviewed the triage vital signs and the nursing notes.  Pertinent labs & imaging results that were available during my care of the patient were reviewed by me and considered in my medical decision making (see chart for  details).     Patient presents with right flank pain and vomiting that have since resolved. Patient is nontoxic appearing, afebrile, not tachycardic, not tachypneic, not hypotensive, maintains SPO2 of 100% on room air, and is in no apparent distress. Patient has no signs of sepsis. A CT renal stone study was discussed along with its purpose. Patient declined the study stating that he has to go to class. Patient advised to follow-up with urology. Home care and return precautions discussed. Patient voices understanding of all instructions and is comfortable with discharge.  Findings and plan of care discussed with Marily Memos, MD after EDP shift change.   Vitals:   05/30/16 5366  05/30/16 0630 05/30/16 0645  BP: 139/64 125/78 131/70  Pulse: 69 65 71  Resp: 16    Temp: 98.1 F (36.7 C)    TempSrc: Oral    SpO2: 100% 100% 100%   Vitals:   05/30/16 0645 05/30/16 0715 05/30/16 0745 05/30/16 0815  BP: 131/70 112/75 117/70 112/65  Pulse: 71 74 65 65  Resp:      Temp:      TempSrc:      SpO2: 100% 99% 100% 100%      Final Clinical Impressions(s) / ED Diagnoses   Final diagnoses:  Right flank pain    New Prescriptions Discharge Medication List as of 05/30/2016  8:34 AM    START taking these medications   Details  HYDROcodone-acetaminophen (NORCO/VICODIN) 5-325 MG tablet Take 1-2 tablets by mouth every 4 (four) hours as needed., Starting Mon 05/30/2016, Print    ondansetron (ZOFRAN ODT) 4 MG disintegrating tablet Take 1 tablet (4 mg total) by mouth every 8 (eight) hours as needed for nausea or vomiting., Starting Mon 05/30/2016, Print    tamsulosin (FLOMAX) 0.4 MG CAPS capsule Take 1 capsule (0.4 mg total) by mouth daily., Starting Mon 05/30/2016, Print         Anselm PancoastShawn C Tipton Ballow, PA-C 05/30/16 [id number]    Tomasita CrumbleAdeleke Oni, MD 05/30/16 1513

## 2016-08-16 ENCOUNTER — Ambulatory Visit (INDEPENDENT_AMBULATORY_CARE_PROVIDER_SITE_OTHER): Payer: Self-pay | Admitting: Adult Health

## 2016-08-16 VITALS — BP 115/72 | Wt 363.8 lb

## 2016-08-16 DIAGNOSIS — R634 Abnormal weight loss: Secondary | ICD-10-CM

## 2016-08-16 NOTE — Patient Instructions (Signed)
Congratulations on the weight loss. Continue to do what you are doing   Please let me know if the ringing in your ears does not resolve after going off Acyclovir

## 2016-08-16 NOTE — Progress Notes (Signed)
Subjective:    Patient ID: Tyler Larson, male    DOB: 09/20/1960, 56 y.o.   MRN: [id number]  HPI  56 year old male who  has a past medical history of Allergic rhinitis; DJD (degenerative joint disease); DJD (degenerative joint disease) of knee; ED (erectile dysfunction); Genital herpes; Insomnia; and Seasonal allergies. He presents to the office today for three month follow up regarding weight loss. During his last office visit he was started on Wellbutrin for medical weight loss. He reports that today that he took the Wellbutrin for about one month and then quit taking it as he felt that he was not losing any weight so he quit taking the medication.   He has been walking and lifting weights and has been trying to eat healthy. He has been fasting from 8 pm to 12 noon and then eating small meals throughout the day.   He had finals over the last week and a half and found it hard to find time to exercise.   He reports " I feel better since I have been losing weight"   Wt Readings from Last 3 Encounters:  08/16/16 (!) 363 lb 12.8 oz (165 kg)  05/19/16 (!) 373 lb 3.2 oz (169.3 kg)  11/05/15 (!) 369 lb 4.8 oz (167.5 kg)    Review of Systems  Constitutional: Negative.   Respiratory: Negative.   Cardiovascular: Negative.   Musculoskeletal: Positive for arthralgias.  Skin: Negative.   All other systems reviewed and are negative.  Past Medical History:  Diagnosis Date  . Allergic rhinitis   . DJD (degenerative joint disease)    right thumb  . DJD (degenerative joint disease) of knee   . ED (erectile dysfunction)   . Genital herpes   . Insomnia   . Seasonal allergies     Social History   Social History  . Marital status: Single    Spouse name: N/A  . Number of children: 2  . Years of education: N/A   Occupational History  . 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 Smoking status: Never Smoker  . Smokeless tobacco: Never Used  . Alcohol  use No  . Drug use: No  . Sexual activity: Not on file   Other Topics Concern  . Not on file   Social History Narrative   He is not working    He is going back to school this fall for RT       Past Surgical History:  Procedure Laterality Date  . COLONOSCOPY N/A 01/13/2015   Procedure: COLONOSCOPY;  Surgeon: Beverley Fiedler, MD;  Location: [facility];  Service: Gastroenterology;  Laterality: N/A;  . KNEE SURGERY     s/p bilat knee x 5 total  . ROTATOR CUFF REPAIR     s/p right rotator cuff tear  . SHOULDER SURGERY     left shoulder tendon sugery  . UMBILICAL HERNIA REPAIR     as infant    Family History  Problem Relation Age of Onset  . Arthritis Mother   . Hypertension Mother   . Colon polyps Neg Hx   . Colon cancer Neg Hx     Allergies  Allergen Reactions  . Nsaids Other (See Comments)    brusing    Current Outpatient Prescriptions on File Prior to Visit  Medication Sig Dispense Refill  . acyclovir (ZOVIRAX) 200 MG capsule TAKE ONE CAPSULE BY MOUTH TWICE DAILY 60 capsule  0  . HYDROcodone-acetaminophen (NORCO/VICODIN) 5-325 MG tablet Take 1-2 tablets by mouth every 4 (four) hours as needed. 20 tablet 0  . ondansetron (ZOFRAN ODT) 4 MG disintegrating tablet Take 1 tablet (4 mg total) by mouth every 8 (eight) hours as needed for nausea or vomiting. 20 tablet 0  . tamsulosin (FLOMAX) 0.4 MG CAPS capsule Take 1 capsule (0.4 mg total) by mouth daily. 30 capsule 0   No current facility-administered medications on file prior to visit.     BP 115/72   Wt (!) 363 lb 12.8 oz (165 kg)   BMI 50.03 kg/m       Objective:   Physical Exam  Constitutional: He is oriented to person, place, and time. He appears well-developed and well-nourished. No distress.  Obesity    Cardiovascular: Normal rate, regular rhythm, normal heart sounds and intact distal pulses.  Exam reveals no gallop and no friction rub.   No murmur heard. Pulmonary/Chest: Effort normal and breath sounds  normal. No respiratory distress. He has no wheezes. He has no rales. He exhibits no tenderness.  Neurological: He is alert and oriented to person, place, and time.  Skin: Skin is warm and dry. He is not diaphoretic. No erythema. No pallor.  Psychiatric: He has a normal mood and affect. His behavior is normal. Judgment and thought content normal.  Nursing note and vitals reviewed.     Assessment & Plan:  1. Weight loss - He has lost 10 pounds since his last visit.  - Advised that it can take longer than one month on wellbutrin to notice results with weight loss. He can restart this medication if he likes.  - Continue to exercise and eat healthy.  - He does not want to go to the weight loss clinic at this time.   Shirline Freesory Gladyes Kudo, NP

## 2019-02-12 ENCOUNTER — Telehealth: Payer: Self-pay | Admitting: *Deleted

## 2019-02-12 NOTE — Telephone Encounter (Signed)
Pt notified and scheduled in Jan for cpx.  Nothing further needed.

## 2019-02-12 NOTE — Telephone Encounter (Signed)
Copied from [place] [id number]. Topic: Appointment Scheduling - Prior Auth Required for Appointment >> Feb 12, 2019 10:40 AM Tyler Larson, Helene Kelp D wrote: No appointment has been scheduled. Patient is requesting CPE  work-in appointment. Per scheduling protocol, this appointment requires a prior authorization prior to scheduling.  Route to department's PEC pool.

## 2019-02-12 NOTE — Telephone Encounter (Signed)
I have no where to put him right now

## 2019-05-08 ENCOUNTER — Encounter: Payer: Self-pay | Admitting: Adult Health

## 2019-07-24 ENCOUNTER — Other Ambulatory Visit: Payer: Self-pay

## 2019-07-25 ENCOUNTER — Ambulatory Visit (INDEPENDENT_AMBULATORY_CARE_PROVIDER_SITE_OTHER): Payer: 59 | Admitting: Adult Health

## 2019-07-25 ENCOUNTER — Encounter: Payer: Self-pay | Admitting: Adult Health

## 2019-07-25 ENCOUNTER — Ambulatory Visit (INDEPENDENT_AMBULATORY_CARE_PROVIDER_SITE_OTHER): Payer: 59

## 2019-07-25 ENCOUNTER — Other Ambulatory Visit (HOSPITAL_COMMUNITY)
Admission: RE | Admit: 2019-07-25 | Discharge: 2019-07-25 | Disposition: A | Discharge status: Home / Self Care | Payer: 59 | Source: Ambulatory Visit | Attending: Adult Health | Admitting: Adult Health

## 2019-07-25 VITALS — BP 134/88 | Temp 97.4°F | Wt 326.0 lb

## 2019-07-25 DIAGNOSIS — M25511 Pain in right shoulder: Secondary | ICD-10-CM | POA: Diagnosis not present

## 2019-07-25 DIAGNOSIS — Z0000 Encounter for general adult medical examination without abnormal findings: Secondary | ICD-10-CM | POA: Diagnosis present

## 2019-07-25 DIAGNOSIS — Z113 Encounter for screening for infections with a predominantly sexual mode of transmission: Secondary | ICD-10-CM

## 2019-07-25 DIAGNOSIS — B009 Herpesviral infection, unspecified: Secondary | ICD-10-CM

## 2019-07-25 DIAGNOSIS — E668 Other obesity: Secondary | ICD-10-CM

## 2019-07-25 DIAGNOSIS — Z125 Encounter for screening for malignant neoplasm of prostate: Secondary | ICD-10-CM

## 2019-07-25 LAB — COMPREHENSIVE METABOLIC PANEL
ALT: 11 U/L (ref 0–53)
AST: 13 U/L (ref 0–37)
Albumin: 3.7 g/dL (ref 3.5–5.2)
Alkaline Phosphatase: 54 U/L (ref 39–117)
BUN: 21 mg/dL (ref 6–23)
CO2: 28 mEq/L (ref 19–32)
Calcium: 8.6 mg/dL (ref 8.4–10.5)
Chloride: 107 mEq/L (ref 96–112)
Creatinine, Ser: 1.08 mg/dL (ref 0.40–1.50)
GFR: 84.51 mL/min (ref >60.00)
Glucose, Bld: 92 mg/dL (ref 70–99)
Potassium: 4.3 mEq/L (ref 3.5–5.1)
Sodium: 142 mEq/L (ref 135–145)
Total Bilirubin: 0.6 mg/dL (ref 0.2–1.2)
Total Protein: 6.6 g/dL (ref 6.0–8.3)

## 2019-07-25 LAB — CBC WITH DIFFERENTIAL/PLATELET
Basophils Absolute: 0 10*3/uL (ref 0.0–0.1)
Basophils Relative: 0.2 % (ref 0.0–3.0)
Eosinophils Absolute: 0.1 10*3/uL (ref 0.0–0.7)
Eosinophils Relative: 1.2 % (ref 0.0–5.0)
HCT: 43.7 % (ref 39.0–52.0)
Hemoglobin: 14.1 g/dL (ref 13.0–17.0)
Lymphocytes Relative: 21.9 % (ref 12.0–46.0)
Lymphs Abs: 1.5 10*3/uL (ref 0.7–4.0)
MCHC: 32.3 g/dL (ref 30.0–36.0)
MCV: 86.7 fl (ref 78.0–100.0)
Monocytes Absolute: 0.6 10*3/uL (ref 0.1–1.0)
Monocytes Relative: 9.3 % (ref 3.0–12.0)
Neutro Abs: 4.6 10*3/uL (ref 1.4–7.7)
Neutrophils Relative %: 67.4 % (ref 43.0–77.0)
Platelets: 212 10*3/uL (ref 150.0–400.0)
RBC: 5.04 Mil/uL (ref 4.22–5.81)
RDW: 14.9 % (ref 11.5–15.5)
WBC: 6.9 10*3/uL (ref 4.0–10.5)

## 2019-07-25 LAB — PSA: PSA: 0.75 ng/mL (ref 0.10–4.00)

## 2019-07-25 LAB — HEMOGLOBIN A1C: Hgb A1c MFr Bld: 5.7 % (ref 4.6–6.5)

## 2019-07-25 LAB — LIPID PANEL
Cholesterol: 151 mg/dL (ref 0–200)
HDL: 37.8 mg/dL — ABNORMAL LOW (ref >39.00)
LDL Cholesterol: 101 mg/dL — ABNORMAL HIGH (ref 0–99)
NonHDL: 113.54
Total CHOL/HDL Ratio: 4
Triglycerides: 65 mg/dL (ref 0.0–149.0)
VLDL: 13 mg/dL (ref 0.0–40.0)

## 2019-07-25 LAB — TSH: TSH: 1.08 u[IU]/mL (ref 0.35–4.50)

## 2019-07-25 MED ORDER — ACYCLOVIR 200 MG PO CAPS: 200.0000 mg | ORAL_CAPSULE | Freq: Two times a day (BID) | ORAL | 3 refills | Status: DC

## 2019-07-25 NOTE — Patient Instructions (Signed)
It was great seeing you today   Keep up the hard work with weight loss.   We will follow up with you regarding your blood work and xray

## 2019-07-25 NOTE — Progress Notes (Signed)
Subjective:    Patient ID: Tyler Larson, male    DOB: 1960/11/24, 59 y.o.   MRN: [id number]  HPI   He has not been seen in the office since 2018. Since we last saw him he graduated with his associates degree and is working to complete his bachelor's degree.  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  Patient presents for yearly preventative medicine examination. He is a pleasant 59 year old male who  has a past medical history of Allergic rhinitis, DJD (degenerative joint disease), DJD (degenerative joint disease) of knee, ED (erectile dysfunction), Genital herpes, Insomnia, and Seasonal allergies.  Obesity - He works out at home, [employer] and eating healthy. Prior to the pandemic he was down to 307 lbs.  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  He does need a refill of this medication.  He denies any recent flares  STD Testing - he would like STD testing done - he denies symptoms or concern   Right Shoulder Pain -has had rotator cuff repair of the shoulder in the past.  Over the last few months since he has been lifting more weight he has had worsening right shoulder pain.  He has full range of motion but does report pain with certain movements such as doing fly exercises and trying to scratch his back.   All immunizations and health maintenance protocols were reviewed with the patient and needed orders were placed. He is up to date on routine vaccinations   Appropriate screening laboratory values were ordered for the patient including screening of hyperlipidemia, renal function and hepatic function. If indicated by BPH, a PSA was ordered.  Medication reconciliation,  past medical history, social history, problem list and allergies were reviewed in detail with the patient  Goals were established  with regard to weight loss, exercise, and  diet in compliance with medications.  He is up to date on routine colon cancer screening   Review of Systems  Constitutional: Negative.   HENT: Negative.   Eyes: Negative.   Respiratory: Negative.   Cardiovascular: Negative.   Gastrointestinal: Negative.   Endocrine: Negative.   Genitourinary: Negative.   Musculoskeletal: Positive for arthralgias.  Skin: Negative.   Allergic/Immunologic: Negative.   Neurological: Negative.   Hematological: Negative.   Psychiatric/Behavioral: Negative.   All other systems reviewed and are negative.  Past Medical History:  Diagnosis Date  . Allergic rhinitis   . DJD (degenerative joint disease)    right thumb  . DJD (degenerative joint disease) of knee   . ED (erectile dysfunction)   . Genital herpes   . Insomnia   . Seasonal allergies     Social History   Socioeconomic History  . Marital status: Single    Spouse name: Not on file  . Number of children: 2  . Years of education: Not on file  . Highest education level: Not on file  Occupational History  . Occupation: Set designer: [employer]    Comment: [employer]  Tobacco Use  . Smoking status: Never Smoker  . Smokeless tobacco: Never Used  Substance and Sexual Activity  . Alcohol use: No    Alcohol/week: 0.0 standard drinks  . Drug use: No  . Sexual activity: Not on file  Other Topics Concern  . Not on file  Social History Narrative   He is not working    He is going back to school this fall for RT   Social Determinants of Health   Financial Resource Strain:   . Difficulty of Paying Living Expenses:   Food Insecurity:   . Worried About Programme researcher, broadcasting/film/video in the Last Year:   . Barista in the Last Year:   Transportation Needs:   . Freight forwarder (Medical):   Marland Kitchen Lack of Transportation (Non-Medical):   Physical Activity:   . Days of Exercise per Week:   . Minutes of Exercise per Session:     Stress:   . Feeling of Stress :   Social Connections:   . Frequency of Communication with Friends and Family:   . Frequency of Social Gatherings with Friends and Family:   . Attends Religious Services:   . Active Member of Clubs or Organizations:   . Attends Banker Meetings:   Marland Kitchen Marital Status:   Intimate Partner Violence:   . Fear of Current or Ex-Partner:   . Emotionally Abused:   Marland Kitchen Physically Abused:   . Sexually Abused:     Past Surgical History:  Procedure Laterality Date  . COLONOSCOPY N/A 01/13/2015   Procedure: COLONOSCOPY;  Surgeon: Beverley Fiedler, MD;  Location: [facility];  Service: Gastroenterology;  Laterality: N/A;  . KNEE SURGERY     s/p bilat knee x 5 total  . ROTATOR CUFF REPAIR     s/p right rotator cuff tear  . SHOULDER SURGERY     left shoulder tendon sugery  . UMBILICAL HERNIA REPAIR     as infant    Family History  Problem Relation Age of Onset  . Arthritis Mother   . Hypertension Mother   . Colon polyps Neg Hx   . Colon cancer Neg Hx     Allergies  Allergen Reactions  . Nsaids Other (See Comments)    brusing    No current outpatient medications on file prior to visit.   No current facility-administered medications on file prior to visit.    BP 134/88   Temp (!) 97.4 F (36.3 C)   Wt (!) 326 lb (147.9 kg)   BMI 44.83 kg/m       Objective:   Physical Exam Vitals and nursing note reviewed.  Constitutional:      General: He is not in acute distress.    Appearance: Normal appearance. He is well-developed. He is obese.  HENT:     Head: Normocephalic and atraumatic.     Right Ear: Tympanic membrane, ear canal and external ear normal. There is no impacted cerumen.     Left Ear: Tympanic membrane, ear canal and external ear normal. There is no impacted cerumen.     Nose: Nose normal. No congestion or rhinorrhea.     Mouth/Throat:     Mouth: Mucous membranes are moist.     Pharynx: Oropharynx is clear. No oropharyngeal  exudate or posterior oropharyngeal erythema.  Eyes:     General:        Right eye: No discharge.        Left eye: No discharge.     Extraocular Movements: Extraocular movements intact.     Conjunctiva/sclera: Conjunctivae normal.     Pupils: Pupils are equal, round, and reactive to light.  Neck:     Vascular: No carotid bruit.  Trachea: No tracheal deviation.  Cardiovascular:     Rate and Rhythm: Normal rate and regular rhythm.     Pulses: Normal pulses.     Heart sounds: Normal heart sounds. No murmur. No friction rub. No gallop.   Pulmonary:     Effort: Pulmonary effort is normal. No respiratory distress.     Breath sounds: Normal breath sounds. No stridor. No wheezing, rhonchi or rales.  Chest:     Chest wall: No tenderness.  Abdominal:     General: Bowel sounds are normal. There is no distension.     Palpations: Abdomen is soft. There is no mass.     Tenderness: There is no abdominal tenderness. There is no right CVA tenderness, left CVA tenderness, guarding or rebound.     Hernia: No hernia is present.  Musculoskeletal:        General: No swelling, tenderness, deformity or signs of injury. Normal range of motion.     Right shoulder: No swelling, tenderness, bony tenderness or crepitus. Normal range of motion. Decreased strength.     Right lower leg: No edema.     Left lower leg: No edema.     Comments: Is able to perform back scratch test and raise his right arm over his head but has discomfort when doing so.  Also has discomfort in the right shoulder when pushing against resistance.  Lymphadenopathy:     Cervical: No cervical adenopathy.  Skin:    General: Skin is warm and dry.     Capillary Refill: Capillary refill takes less than 2 seconds.     Coloration: Skin is not jaundiced or pale.     Findings: No bruising, erythema, lesion or rash.  Neurological:     General: No focal deficit present.     Mental Status: He is alert and oriented to person, place, and time.      Cranial Nerves: No cranial nerve deficit.     Sensory: No sensory deficit.     Motor: No weakness.     Coordination: Coordination normal.     Gait: Gait normal.     Deep Tendon Reflexes: Reflexes normal.  Psychiatric:        Mood and Affect: Mood normal.        Behavior: Behavior normal.        Thought Content: Thought content normal.        Judgment: Judgment normal.        Assessment & Plan:  1. Routine general medical examination at a health care facility - Continue to work on weight loss through diet and exercise - Follow up in one year or sooner if needed - CBC with Differential/Platelet - Comprehensive metabolic panel - Hemoglobin A1c - Lipid panel - PSA - TSH - HIV Antibody (routine testing w rflx) - RPR - Urine cytology ancillary only  2. Acute pain of right shoulder - Consider steroid injection  - DG Shoulder Right; Future  3. Screen for STD (sexually transmitted disease)  - HIV Antibody (routine testing w rflx) - RPR - Urine cytology ancillary only  4. Other obesity - Encouraged to continue to work on weight loss through diet and exercise. He has lost 37 pounds since we last saw him  - CBC with Differential/Platelet - Comprehensive metabolic panel - Hemoglobin A1c - Lipid panel - TSH   5. Herpes simplex virus (HSV) infection  - acyclovir (ZOVIRAX) 200 MG capsule; Take 1 capsule (200 mg total) by mouth 2 (two) times daily.  Dispense: 180 capsule; Refill: 3   Dorothyann Peng, NP

## 2019-07-25 NOTE — Addendum Note (Signed)
Addended by: Philemon Kingdom on: 07/25/2019 08:07 AM   Modules accepted: Orders

## 2019-07-26 LAB — RPR: RPR Ser Ql: NONREACTIVE

## 2019-07-26 LAB — URINE CYTOLOGY ANCILLARY ONLY
Chlamydia: NEGATIVE
Comment: NEGATIVE
Comment: NEGATIVE
Comment: NORMAL
Neisseria Gonorrhea: NEGATIVE
Trichomonas: NEGATIVE

## 2019-07-26 LAB — HIV ANTIBODY (ROUTINE TESTING W REFLEX): HIV 1&2 Ab, 4th Generation: NONREACTIVE

## 2019-08-01 ENCOUNTER — Other Ambulatory Visit: Payer: Self-pay

## 2019-08-02 ENCOUNTER — Ambulatory Visit (INDEPENDENT_AMBULATORY_CARE_PROVIDER_SITE_OTHER): Payer: 59 | Admitting: Adult Health

## 2019-08-02 ENCOUNTER — Encounter: Payer: Self-pay | Admitting: Adult Health

## 2019-08-02 VITALS — BP 136/90 | Temp 96.7°F | Wt 330.0 lb

## 2019-08-02 DIAGNOSIS — M25511 Pain in right shoulder: Secondary | ICD-10-CM

## 2019-08-02 MED ORDER — METHYLPREDNISOLONE ACETATE 80 MG/ML IJ SUSP
80.0000 mg | Freq: Once | INTRAMUSCULAR | Status: AC
  Administered 2019-08-02: 80 mg via INTRA_ARTICULAR

## 2019-08-02 NOTE — Progress Notes (Signed)
Subjective:    Patient ID: Tyler Larson, male    DOB: 1960/06/11, 59 y.o.   MRN: [id number]  HPI 59 year old male who  has a past medical history of Allergic rhinitis, DJD (degenerative joint disease), DJD (degenerative joint disease) of knee, ED (erectile dysfunction), Genital herpes, Insomnia, and Seasonal allergies.  He presents to the office today for follow up regarding pain in the right shoulder. He does have a history of rotator cuff repair in the past. Over the last few months he has had increased shoulder pain. He does have full ROM but pain is worse with certain movements such as fly exercises and trying to scratch his back.   His Xray on 4/15/20201 showed   IMPRESSION: Negative for acute fracture.  Subacromial spurring.  Questionable changes of calcific tendinitis.  Surgical changes of prior rotator cuff repair.  He is here today for subacromial steroid injection.   Review of Systems See HPI  Past Medical History:  Diagnosis Date  . Allergic rhinitis   . DJD (degenerative joint disease)    right thumb  . DJD (degenerative joint disease) of knee   . ED (erectile dysfunction)   . Genital herpes   . Insomnia   . Seasonal allergies     Social History   Socioeconomic History  . Marital status: Single    Spouse name: Not on file  . Number of children: 2  . Years of education: Not on file  . Highest education level: Not on file  Occupational History  . Occupation: Set designer: [employer]    Comment: [employer]  Tobacco Use  . Smoking status: Never Smoker  . Smokeless tobacco: Never Used  Substance and Sexual Activity  . Alcohol use: No    Alcohol/week: 0.0 standard drinks  . Drug use: No  . Sexual activity: Not on file  Other Topics Concern  . Not on file  Social History Narrative   He is not working    He is going back to school this fall for RT   Social Determinants of Health   Financial Resource Strain:   . Difficulty of  Paying Living Expenses:   Food Insecurity:   . Worried About Programme researcher, broadcasting/film/video in the Last Year:   . Barista in the Last Year:   Transportation Needs:   . Freight forwarder (Medical):   Marland Kitchen Lack of Transportation (Non-Medical):   Physical Activity:   . Days of Exercise per Week:   . Minutes of Exercise per Session:   Stress:   . Feeling of Stress :   Social Connections:   . Frequency of Communication with Friends and Family:   . Frequency of Social Gatherings with Friends and Family:   . Attends Religious Services:   . Active Member of Clubs or Organizations:   . Attends Banker Meetings:   Marland Kitchen Marital Status:   Intimate Partner Violence:   . Fear of Current or Ex-Partner:   . Emotionally Abused:   Marland Kitchen Physically Abused:   . Sexually Abused:     Past Surgical History:  Procedure Laterality Date  . COLONOSCOPY N/A 01/13/2015   Procedure: COLONOSCOPY;  Surgeon: Beverley Fiedler, MD;  Location: [facility];  Service: Gastroenterology;  Laterality: N/A;  . KNEE SURGERY     s/p bilat knee x 5 total  . ROTATOR CUFF REPAIR     s/p right rotator cuff tear  . SHOULDER  SURGERY     left shoulder tendon sugery  . UMBILICAL HERNIA REPAIR     as infant    Family History  Problem Relation Age of Onset  . Arthritis Mother   . Hypertension Mother   . Colon polyps Neg Hx   . Colon cancer Neg Hx     Allergies  Allergen Reactions  . Nsaids Other (See Comments)    brusing    Current Outpatient Medications on File Prior to Visit  Medication Sig Dispense Refill  . acyclovir (ZOVIRAX) 200 MG capsule Take 1 capsule (200 mg total) by mouth 2 (two) times daily. 180 capsule 3   No current facility-administered medications on file prior to visit.    There were no vitals taken for this visit.      Objective:   Physical Exam Vitals and nursing note reviewed.  Constitutional:      Appearance: Normal appearance.  Musculoskeletal:        General: Tenderness  present. No swelling. Normal range of motion.  Skin:    General: Skin is warm and dry.  Neurological:     General: No focal deficit present.     Mental Status: He is alert and oriented to person, place, and time.  Psychiatric:        Mood and Affect: Mood normal.        Behavior: Behavior normal.        Thought Content: Thought content normal.        Judgment: Judgment normal.       Assessment & Plan:  1. Acute pain of right shoulder Shoulder injection Verbal consent obtained and verified. Sterile betadine prep. Furthur cleansed with alcohol. Topical analgesic spray: Ethyl chloride. Joint: right subacromial injection Approached in typical fashion with: posterior approach Completed without difficulty Meds: 3 cc lidocaine 2% no epi, 1 cc depomedrol 80mg /cc Needle:1.5 inch 25 gauge Aftercare instructions and Red flags advised.   - methylPREDNISolone acetate (DEPO-MEDROL) injection 80 mg   Dorothyann Peng, NP

## 2019-08-02 NOTE — Patient Instructions (Signed)
It was great seeing you today   Please let me how the steroid injection works for you

## 2019-08-05 ENCOUNTER — Telehealth: Payer: Self-pay | Admitting: Adult Health

## 2019-08-05 NOTE — Telephone Encounter (Signed)
Patient states Tyler Larson wanted him to call back with an update for him.  He states he is feeling a little bit better but still is experiencing discomfort.  He feels like he needs another shot.

## 2019-08-06 NOTE — Telephone Encounter (Signed)
I would have him wait 2-3 weeks before another shot

## 2019-08-06 NOTE — Telephone Encounter (Signed)
Pt notified and scheduled for 2nd shoulder injection at patients request.  Nothing further needed.

## 2019-08-15 ENCOUNTER — Other Ambulatory Visit: Payer: Self-pay

## 2019-08-16 ENCOUNTER — Encounter: Payer: Self-pay | Admitting: Adult Health

## 2019-08-16 ENCOUNTER — Ambulatory Visit (INDEPENDENT_AMBULATORY_CARE_PROVIDER_SITE_OTHER): Payer: 59 | Admitting: Adult Health

## 2019-08-16 VITALS — BP 138/90 | Temp 97.2°F | Wt 335.0 lb

## 2019-08-16 DIAGNOSIS — M25511 Pain in right shoulder: Secondary | ICD-10-CM | POA: Diagnosis not present

## 2019-08-16 MED ORDER — METHYLPREDNISOLONE ACETATE 80 MG/ML IJ SUSP
80.0000 mg | Freq: Once | INTRAMUSCULAR | Status: AC
  Administered 2019-08-16: 80 mg via INTRA_ARTICULAR

## 2019-08-16 NOTE — Progress Notes (Signed)
   Subjective:    Patient ID: Tyler Larson, male    DOB: 19-Feb-1961, 58 y.o.   MRN: [id number]  HPI 59 year old male who  has a past medical history of Allergic rhinitis, DJD (degenerative joint disease), DJD (degenerative joint disease) of knee, ED (erectile dysfunction), Genital herpes, Insomnia, and Seasonal allergies.  He presents to the office today for continued right shoulder pain. He was originally seen on 08/02/2019 for this issue and a steroid injection was performed. He reports that he did get a few days of relief but the pain never resolved completely. After those first few days s/p injection the pain has returned to his baseline.    Review of Systems See HPI     Objective:   Physical Exam Vitals and nursing note reviewed.  Constitutional:      Appearance: Normal appearance.  Musculoskeletal:        General: Tenderness present. Normal range of motion.  Skin:    General: Skin is warm and dry.     Capillary Refill: Capillary refill takes less than 2 seconds.  Neurological:     General: No focal deficit present.     Mental Status: He is alert and oriented to person, place, and time.  Psychiatric:        Mood and Affect: Mood normal.        Behavior: Behavior normal.        Thought Content: Thought content normal.        Judgment: Judgment normal.       Assessment & Plan:  1. Acute pain of right shoulder - We discussed doing another steroid injection vs orthopedics referral. He would like to try doing another steroid injection,as he did get some pain relief from the first one.  if this does not work then will refer to orthopedics. Shoulder injection Verbal consent obtained and verified. Sterile betadine prep. Furthur cleansed with alcohol. Topical analgesic spray: Ethyl chloride. Joint: right subacromial injection Approached in typical fashion with: posterior approach Completed without difficulty Meds: 3 cc lidocaine 2% no epi, 1 cc depomedrol 80mg /cc Needle:1.5  inch 25 gauge Aftercare instructions and Red flags advised. Tolerated procedure well.   - methylPREDNISolone acetate (DEPO-MEDROL) injection 80 mg - He will let me know if there is no improvement in his symptoms over the weekend   , NP

## 2020-08-05 ENCOUNTER — Encounter: Payer: 59 | Admitting: Adult Health

## 2020-08-26 ENCOUNTER — Encounter: Payer: 59 | Admitting: Adult Health

## 2020-08-27 ENCOUNTER — Other Ambulatory Visit: Payer: Self-pay | Admitting: Adult Health

## 2020-08-27 DIAGNOSIS — Z0000 Encounter for general adult medical examination without abnormal findings: Secondary | ICD-10-CM

## 2020-08-27 DIAGNOSIS — B009 Herpesviral infection, unspecified: Secondary | ICD-10-CM

## 2020-10-28 ENCOUNTER — Encounter: Payer: 59 | Admitting: Adult Health

## 2020-11-27 ENCOUNTER — Other Ambulatory Visit: Payer: Self-pay | Admitting: Adult Health

## 2020-11-27 DIAGNOSIS — B009 Herpesviral infection, unspecified: Secondary | ICD-10-CM

## 2020-11-27 DIAGNOSIS — Z0000 Encounter for general adult medical examination without abnormal findings: Secondary | ICD-10-CM

## 2020-12-09 ENCOUNTER — Encounter: Payer: 59 | Admitting: Adult Health

## 2021-01-08 ENCOUNTER — Encounter: Payer: 59 | Admitting: Adult Health

## 2021-01-12 DIAGNOSIS — U071 COVID-19: Secondary | ICD-10-CM

## 2021-01-12 HISTORY — DX: COVID-19: U07.1

## 2021-01-14 ENCOUNTER — Encounter: Payer: Self-pay | Admitting: Family Medicine

## 2021-01-14 ENCOUNTER — Telehealth (INDEPENDENT_AMBULATORY_CARE_PROVIDER_SITE_OTHER): Payer: 59 | Admitting: Family Medicine

## 2021-01-14 DIAGNOSIS — U071 COVID-19: Secondary | ICD-10-CM | POA: Diagnosis not present

## 2021-01-14 MED ORDER — MOLNUPIRAVIR EUA 200MG CAPSULE: 4.0000 | ORAL_CAPSULE | Freq: Two times a day (BID) | ORAL | 0 refills | Status: AC

## 2021-01-14 MED ORDER — BENZONATATE 200 MG PO CAPS: 200.0000 mg | ORAL_CAPSULE | Freq: Two times a day (BID) | ORAL | 0 refills | Status: DC | PRN

## 2021-01-14 NOTE — Patient Instructions (Addendum)
---------------------------------------------------------------------------------------------------------------------------    WORK SLIP:  Patient Tyler Larson,  11/03/1960, was seen for a medical visit today, 01/14/21 . Please excuse from work for a COVID like illness. We advise 10 days minimum from the onset of symptoms (01/11/21) PLUS 1 day of no fever and improved symptoms. Will defer to employer for a sooner return to work if symptoms have resolved, it is greater than 5 days since the positive test and the patient can wear a high-quality, tight fitting mask such as N95 or KN95 at all times for an additional 5 days. Would also suggest COVID19 antigen testing is negative prior to return.  Sincerely: E-signature: Dr. Colin Benton, DO [facility] Ph: [phone]   ------------------------------------------------------------------------------------------------------------------------------    HOME CARE TIPS:  -I sent the medication(s) we discussed to your pharmacy: Meds ordered this encounter  Medications   molnupiravir EUA (LAGEVRIO) 200 mg CAPS capsule    Sig: Take 4 capsules (800 mg total) by mouth 2 (two) times daily for 5 days.    Dispense:  40 capsule    Refill:  0   benzonatate (TESSALON) 200 MG capsule    Sig: Take 1 capsule (200 mg total) by mouth 2 (two) times daily as needed for cough.    Dispense:  20 capsule    Refill:  0     -I sent in the [place] treatment or referral you requested per our discussion. Please see the information provided below and discuss further with the pharmacist/treatment team.   -there is a chance of rebound illness after finishing your treatment. If you become sick again please isolate for an additional 5 days, plus 5 more days of masking.   -can use tylenol if needed for fevers, aches and pains per instructions  -can use nasal saline a few times per day if you have nasal congestion  -stay hydrated, drink plenty  of fluids and eat small healthy meals - avoid dairy  -If the Covid test is positive, check out the [facility] website for more information on home care, transmission and treatment for COVID19  -follow up with your doctor in 2-3 days unless improving and feeling better  -stay home while sick, except to seek medical care. If you have COVID19, ideally it would be best to stay home for a full 10 days since the onset of symptoms PLUS one day of no fever and feeling better. Wear a good mask that fits snugly (such as N95 or KN95) if around others to reduce the risk of transmission.  It was nice to meet you today, and I really hope you are feeling better soon. I help [facility] out with telemedicine visits on Tuesdays and Thursdays and am available for visits on those days. If you have any concerns or questions following this visit please schedule a follow up visit with your Primary Care doctor or seek care at a local urgent care clinic to avoid delays in care.    Seek in person care or schedule a follow up video visit promptly if your symptoms worsen, new concerns arise or you are not improving with treatment. Call 911 and/or seek emergency care if your symptoms are severe or life threatening.    Fact Sheet for Patients And Caregivers Emergency Use Authorization (EUA) Of LAGEVRIOT (molnupiravir) capsules For Coronavirus Disease 2019 (COVID-19)  What is the most important information I should know about LAGEVRIO? LAGEVRIO may cause serious side effects, including: ? LAGEVRIO may cause harm to your unborn  baby. It is not known if LAGEVRIO will harm your baby if you take LAGEVRIO during pregnancy. o LAGEVRIO is not recommended for use in pregnancy. o LAGEVRIO has not been studied in pregnancy. LAGEVRIO was studied in pregnant animals only. When LAGEVRIO was given to pregnant animals, LAGEVRIO caused harm to their unborn babies. o You and your healthcare provider may decide that you should take LAGEVRIO  during pregnancy if there are no other COVID-19 treatment options approved or authorized by the FDA that are accessible or clinically appropriate for you. o If you and your healthcare provider decide that you should take LAGEVRIO during pregnancy, you and your healthcare provider should discuss the known and potential benefits and the potential risks of taking LAGEVRIO during pregnancy. For individuals who are able to become pregnant: ? You should use a reliable method of birth control (contraception) consistently and correctly during treatment with LAGEVRIO and for 4 days after the last dose of LAGEVRIO. Talk to your healthcare provider about reliable birth control methods. ? Before starting treatment with [facility] your healthcare provider may do a pregnancy test to see if you are pregnant before starting treatment with LAGEVRIO. ? Tell your healthcare provider right away if you become pregnant or think you may be pregnant during treatment with LAGEVRIO. Pregnancy Surveillance Program: ? There is a pregnancy surveillance program for individuals who take LAGEVRIO during pregnancy. The purpose of this program is to collect information about the health of you and your baby. Talk to your healthcare provider about how to take part in this program. ? If you take LAGEVRIO during pregnancy and you agree to participate in the pregnancy surveillance program and allow your healthcare provider to share your information with [place], then your healthcare provider will report your use of [place] during pregnancy to [facility]. by calling [phone] or [email or web address]. For individuals who are sexually active with partners who are able to become pregnant: ? It is not known if LAGEVRIO can affect sperm. While the risk is regarded as low, animal studies to fully assess the potential for LAGEVRIO to affect the babies of males treated with LAGEVRIO have not been  completed. A reliable method of birth control (contraception) should be used consistently and correctly during treatment with LAGEVRIO and for at least 3 months after the last dose. The risk to sperm beyond 3 months is not known. Studies to understand the risk to sperm beyond 3 months are ongoing. Talk to your healthcare provider about reliable birth control methods. Talk to your healthcare provider if you have questions or concerns about how LAGEVRIO may affect sperm. You are being given this fact sheet because your healthcare provider believes it is necessary to provide you with LAGEVRIO for the treatment of adults with mild-to-moderate coronavirus disease 2019 (COVID-19) with positive results of direct SARS-CoV-2 viral testing, and who are at high risk for progression to severe COVID-19 including hospitalization or death, and for whom other COVID-19 treatment options approved or authorized by the FDA are not accessible or clinically appropriate. The U.S. Food and Drug Administration (FDA) has issued an Emergency Use Authorization (EUA) to make LAGEVRIO available during the COVID-19 pandemic (for more details about an EUA please see "What is an Emergency Use Authorization?" at the end of this document). LAGEVRIO is not an FDA-approved medicine in the [place]. Read this Fact Sheet for information about LAGEVRIO. Talk to your healthcare provider about your options if you have any questions. It is  your choice to take LAGEVRIO.  What is COVID-19? COVID-19 is caused by a virus called a coronavirus. You can get COVID-19 through close contact with another person who has the virus. COVID-19 illnesses have ranged from very mild-to-severe, including illness resulting in death. While information so far suggests that most COVID-19 illness is mild, serious illness can happen and may cause some of your other medical conditions to become worse. Older people and people of all ages with severe, long  lasting (chronic) medical conditions like heart disease, lung disease and diabetes, for example seem to be at higher risk of being hospitalized for COVID-19.  What is LAGEVRIO? LAGEVRIO is an investigational medicine used to treat mild-to-moderate COVID-19 in adults: ? with positive results of direct SARS-CoV-2 viral testing, and ? who are at high risk for progression to severe COVID-19 including hospitalization or death, and for whom other COVID-19 treatment options approved or authorized by the FDA are not accessible or clinically appropriate. The FDA has authorized the emergency use of LAGEVRIO for the treatment of mild-tomoderate COVID-19 in adults under an EUA. For more information on EUA, see the "What is an Emergency Use Authorization (EUA)?" section at the end of this Fact Sheet. LAGEVRIO is not authorized: ? for use in people less than 12 years of age. ? for prevention of COVID-19. ? for people needing hospitalization for COVID-19. ? for use for longer than 5 consecutive days.  What should I tell my healthcare provider before I take LAGEVRIO? Tell your healthcare provider if you: ? Have any allergies ? Are breastfeeding or plan to breastfeed ? Have any serious illnesses ? Are taking any medicines (prescription, over-the-counter, vitamins, or herbal products).  How do I take LAGEVRIO? ? Take LAGEVRIO exactly as your healthcare provider tells you to take it. ? Take 4 capsules of LAGEVRIO every 12 hours (for example, at 8 am and at 8 pm) ? Take LAGEVRIO for 5 days. It is important that you complete the full 5 days of treatment with LAGEVRIO. Do not stop taking LAGEVRIO before you complete the full 5 days of treatment, even if you feel better. ? Take LAGEVRIO with or without food. ? You should stay in isolation for as long as your healthcare provider tells you to. Talk to your healthcare provider if you are not sure about how to properly isolate while you have COVID-19. ?  Swallow LAGEVRIO capsules whole. Do not open, break, or crush the capsules. If you cannot swallow capsules whole, tell your healthcare provider. ? What to do if you miss a dose: o If it has been less than 10 hours since the missed dose, take it as soon as you remember o If it has been more than 10 hours since the missed dose, skip the missed dose and take your dose at the next scheduled time. ? Do not double the dose of LAGEVRIO to make up for a missed dose.  What are the important possible side effects of LAGEVRIO? ? See, "What is the most important information I should know about LAGEVRIO?" ? Allergic Reactions. Allergic reactions can happen in people taking LAGEVRIO, even after only 1 dose. Stop taking LAGEVRIO and call your healthcare provider right away if you get any of the following symptoms of an allergic reaction: o hives o rapid heartbeat o trouble swallowing or breathing o swelling of the mouth, lips, or face o throat tightness o hoarseness o skin rash The most common side effects of LAGEVRIO are: ? diarrhea ? nausea ?  dizziness These are not all the possible side effects of LAGEVRIO. Not many people have taken LAGEVRIO. Serious and unexpected side effects may happen. This medicine is still being studied, so it is possible that all of the risks are not known at this time.  What other treatment choices are there?  Veklury (remdesivir) is FDA-approved as an intravenous (IV) infusion for the treatment of mildto-moderate CVUDT-14 in certain adults and children. Talk with your doctor to see if Marijean Heath is appropriate for you. Like LAGEVRIO, FDA may also allow for the emergency use of other medicines to treat people with COVID-19. Go to LacrosseProperties.si for more information. It is your choice to be treated or not to be treated with LAGEVRIO. Should you decide not to take  it, it will not change your standard medical care.  What if I am breastfeeding? Breastfeeding is not recommended during treatment with LAGEVRIO and for 4 days after the last dose of LAGEVRIO. If you are breastfeeding or plan to breastfeed, talk to your healthcare provider about your options and specific situation before taking LAGEVRIO.  How do I report side effects with LAGEVRIO? Contact your healthcare provider if you have any side effects that bother you or do not go away. Report side effects to FDA MedWatch at SmoothHits.hu or call 1-800-FDA-1088 (1- (678)420-5443).  How should I store [place]? ? Store LAGEVRIO capsules at room temperature between 52F to 9F (20C to 25C). ? Keep LAGEVRIO and all medicines out of the reach of children and pets. How can I learn more about COVID-19? ? Ask your healthcare provider. ? Visit SeekRooms.co.uk ? Contact your local or state public health department. ? Call [place] at [phone] (toll free in the [place] ? Visit www.molnupiravir.com  What Is an Emergency Use Authorization (EUA)? The [place] FDA has made [place] available under an emergency access mechanism called an Emergency Use Authorization (EUA) The EUA is supported by a Presenter, broadcasting Health and Human Service (HHS) declaration that circumstances exist to justify emergency use of drugs and biological products during the COVID-19 pandemic. LAGEVRIO for the treatment of mild-to-moderate COVID-19 in adults with positive results of direct SARS-CoV-2 viral testing, who are at high risk for progression to severe COVID-19, including hospitalization or death, and for whom alternative COVID-19 treatment options approved or authorized by FDA are not accessible or clinically appropriate, has not undergone the same type of review as an FDA-approved product. In issuing an EUA under the ASUOR-56 public health emergency, the FDA has determined, among other things, that  based on the total amount of scientific evidence available including data from adequate and well-controlled clinical trials, if available, it is reasonable to believe that the product may be effective for diagnosing, treating, or preventing COVID-19, or a serious or life-threatening disease or condition caused by COVID-19; that the known and potential benefits of the product, when used to diagnose, treat, or prevent such disease or condition, outweigh the known and potential risks of such product; and that there are no adequate, approved, and available alternatives.  All of these criteria must be met to allow for the product to be used in the treatment of patients during the COVID-19 pandemic. The EUA for LAGEVRIO is in effect for the duration of the COVID-19 declaration justifying emergency use of LAGEVRIO, unless terminated or revoked (after which LAGEVRIO may no longer be used under the EUA). For patent information: http://rogers.info/ Copyright  2021-2022 [place] [place], [place] and its affiliates. All rights  reserved. [id number] Revised: March 2022

## 2021-01-14 NOTE — Progress Notes (Signed)
Virtual Visit via Video Note  I connected with Tyler Larson  on 01/14/21 at 11:20 AM EDT by a video enabled telemedicine application and verified that I am speaking with the correct person using two identifiers.  Location patient: home, [place] Location provider:work or home office Persons participating in the virtual visit: patient, provider  I discussed the limitations of evaluation and management by telemedicine and the availability of in person appointments. The patient expressed understanding and agreed to proceed.   HPI:  Acute telemedicine visit for Covid19: -Onset: about 4 days ago- tested positive for covid on 2 tests today -Symptoms include:nasal congestion, cough, sore throat, if stands up to fast feels a little dizzy, body aches, feels tired -has been drinking some - but not a lot as has a poor appetite -Denies:fevers, CP, SOB, NVD, inability to eat/drink/get out of bed -Pertinent past medical history: Obesity -Pertinent medication allergies:  Allergies  Allergen Reactions   Nsaids Other (See Comments)    brusing  -COVID-19 vaccine status: has 2 doses and had 2 booster -no recent labs -no concern for sexual activity that would result in pregnancy  ROS: See pertinent positives and negatives per HPI.  Past Medical History:  Diagnosis Date   Allergic rhinitis    DJD (degenerative joint disease)    right thumb   DJD (degenerative joint disease) of knee    ED (erectile dysfunction)    Genital herpes    Insomnia    Seasonal allergies     Past Surgical History:  Procedure Laterality Date   COLONOSCOPY N/A 01/13/2015   Procedure: COLONOSCOPY;  Surgeon: Beverley Fiedler, MD;  Location: [facility];  Service: Gastroenterology;  Laterality: N/A;   KNEE SURGERY     s/p bilat knee x 5 total   ROTATOR CUFF REPAIR     s/p right rotator cuff tear   SHOULDER SURGERY     left shoulder tendon sugery   UMBILICAL HERNIA REPAIR     as infant     Current Outpatient Medications:     benzonatate (TESSALON) 200 MG capsule, Take 1 capsule (200 mg total) by mouth 2 (two) times daily as needed for cough., Disp: 20 capsule, Rfl: 0   molnupiravir EUA (LAGEVRIO) 200 mg CAPS capsule, Take 4 capsules (800 mg total) by mouth 2 (two) times daily for 5 days., Disp: 40 capsule, Rfl: 0   acyclovir (ZOVIRAX) 200 MG capsule, Take 1 capsule by mouth twice daily, Disp: 180 capsule, Rfl: 0  EXAM:  VITALS per patient if applicable:  GENERAL: alert, oriented, appears well and in no acute distress  HEENT: atraumatic, conjunttiva clear, no obvious abnormalities on inspection of external nose and ears  NECK: normal movements of the head and neck  LUNGS: on inspection no signs of respiratory distress, breathing rate appears normal, no obvious gross SOB, gasping or wheezing  CV: no obvious cyanosis  MS: moves all visible extremities without noticeable abnormality  PSYCH/NEURO: pleasant and cooperative, no obvious depression or anxiety, speech and thought processing grossly intact  ASSESSMENT AND PLAN:  Discussed the following assessment and plan:  COVID-19   Discussed treatment options (infusions and oral options and risk of drug interactions), ideal treatment window, potential complications, isolation and precautions for COVID-19.  Discussed possibility of rebound with antivirals and the need to reisolate if it should occur for 5 days. Checked for/reviewed any labs done in the last 90 days with GFR listed in HPI if available. After lengthy discussion, the patient opted for treatment with Legevrio  due to being higher risk for complications of covid or severe disease and other factors. Discussed EUA status of this drug and the fact that there is preliminary limited knowledge of risks/interactions/side effects per EUA document vs possible benefits and precautions. This information was shared with patient during the visit and also was provided in patient instructions. Also, advised that patient  discuss risks/interactions and use with pharmacist/treatment team as well. The patient did want a prescription for cough, Tessalon Rx sent.  Other symptomatic care measures summarized in patient instructions. Work/School slipped offered: provided in patient instructions   Advised to seek prompt in person care if worsening, new symptoms arise, or if is not improving with treatment. Discussed options for inperson care if PCP office not available. Did let this patient know that I only do telemedicine on Tuesdays and Thursdays for [place]. Advised to schedule follow up visit with PCP or [facility] if any further questions or concerns to avoid delays in care.   I discussed the assessment and treatment plan with the patient. The patient was provided an opportunity to ask questions and all were answered. The patient agreed with the plan and demonstrated an understanding of the instructions.     Terressa Koyanagi, DO

## 2021-02-08 ENCOUNTER — Other Ambulatory Visit: Payer: Self-pay

## 2021-02-09 ENCOUNTER — Encounter: Payer: Self-pay | Admitting: Adult Health

## 2021-02-09 ENCOUNTER — Ambulatory Visit (INDEPENDENT_AMBULATORY_CARE_PROVIDER_SITE_OTHER): Payer: 59 | Admitting: Adult Health

## 2021-02-09 VITALS — BP 142/80 | HR 77 | Temp 98.6°F | Ht 70.0 in | Wt 343.8 lb

## 2021-02-09 DIAGNOSIS — M159 Polyosteoarthritis, unspecified: Secondary | ICD-10-CM

## 2021-02-09 DIAGNOSIS — E668 Other obesity: Secondary | ICD-10-CM

## 2021-02-09 DIAGNOSIS — N401 Enlarged prostate with lower urinary tract symptoms: Secondary | ICD-10-CM | POA: Diagnosis not present

## 2021-02-09 DIAGNOSIS — B009 Herpesviral infection, unspecified: Secondary | ICD-10-CM | POA: Diagnosis not present

## 2021-02-09 DIAGNOSIS — Z23 Encounter for immunization: Secondary | ICD-10-CM

## 2021-02-09 DIAGNOSIS — Z0000 Encounter for general adult medical examination without abnormal findings: Secondary | ICD-10-CM | POA: Diagnosis not present

## 2021-02-09 DIAGNOSIS — R3914 Feeling of incomplete bladder emptying: Secondary | ICD-10-CM | POA: Diagnosis not present

## 2021-02-09 LAB — COMPREHENSIVE METABOLIC PANEL
ALT: 16 U/L (ref 0–53)
AST: 16 U/L (ref 0–37)
Albumin: 3.8 g/dL (ref 3.5–5.2)
Alkaline Phosphatase: 56 U/L (ref 39–117)
BUN: 14 mg/dL (ref 6–23)
CO2: 28 mEq/L (ref 19–32)
Calcium: 8.8 mg/dL (ref 8.4–10.5)
Chloride: 103 mEq/L (ref 96–112)
Creatinine, Ser: 1.18 mg/dL (ref 0.40–1.50)
GFR: 66.78 mL/min (ref >60.00)
Glucose, Bld: 96 mg/dL (ref 70–99)
Potassium: 4.6 mEq/L (ref 3.5–5.1)
Sodium: 139 mEq/L (ref 135–145)
Total Bilirubin: 0.7 mg/dL (ref 0.2–1.2)
Total Protein: 7 g/dL (ref 6.0–8.3)

## 2021-02-09 LAB — TSH: TSH: 1.5 u[IU]/mL (ref 0.35–5.50)

## 2021-02-09 LAB — CBC WITH DIFFERENTIAL/PLATELET
Basophils Absolute: 0 10*3/uL (ref 0.0–0.1)
Basophils Relative: 0.2 % (ref 0.0–3.0)
Eosinophils Absolute: 0.1 10*3/uL (ref 0.0–0.7)
Eosinophils Relative: 0.9 % (ref 0.0–5.0)
HCT: 44.3 % (ref 39.0–52.0)
Hemoglobin: 14.5 g/dL (ref 13.0–17.0)
Lymphocytes Relative: 22.6 % (ref 12.0–46.0)
Lymphs Abs: 1.7 10*3/uL (ref 0.7–4.0)
MCHC: 32.8 g/dL (ref 30.0–36.0)
MCV: 84.6 fl (ref 78.0–100.0)
Monocytes Absolute: 0.6 10*3/uL (ref 0.1–1.0)
Monocytes Relative: 8.6 % (ref 3.0–12.0)
Neutro Abs: 5.1 10*3/uL (ref 1.4–7.7)
Neutrophils Relative %: 67.7 % (ref 43.0–77.0)
Platelets: 207 10*3/uL (ref 150.0–400.0)
RBC: 5.24 Mil/uL (ref 4.22–5.81)
RDW: 14.1 % (ref 11.5–15.5)
WBC: 7.5 10*3/uL (ref 4.0–10.5)

## 2021-02-09 LAB — LIPID PANEL
Cholesterol: 170 mg/dL (ref 0–200)
HDL: 40 mg/dL (ref >39.00)
LDL Cholesterol: 111 mg/dL — ABNORMAL HIGH (ref 0–99)
NonHDL: 129.84
Total CHOL/HDL Ratio: 4
Triglycerides: 96 mg/dL (ref 0.0–149.0)
VLDL: 19.2 mg/dL (ref 0.0–40.0)

## 2021-02-09 LAB — PSA: PSA: 0.77 ng/mL (ref 0.10–4.00)

## 2021-02-09 LAB — HEMOGLOBIN A1C: Hgb A1c MFr Bld: 6 % (ref 4.6–6.5)

## 2021-02-09 MED ORDER — TAMSULOSIN HCL 0.4 MG PO CAPS: 0.4000 mg | ORAL_CAPSULE | Freq: Every day | ORAL | 1 refills | Status: DC

## 2021-02-09 NOTE — Progress Notes (Signed)
Subjective:    Patient ID: Tyler Larson, male    DOB: 04-Oct-1960, 60 y.o.   MRN: [id number]  HPI Patient presents for yearly preventative medicine examination. He is a pleasant 60 year old male who  has a past medical history of Allergic rhinitis, COVID (01/12/2021), DJD (degenerative joint disease), DJD (degenerative joint disease) of knee, ED (erectile dysfunction), Genital herpes, Insomnia, and Seasonal allergies.  Obesity  has fallen off the wagon when it comes to weight loss through diet and exercise. 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  Denies any recent flares  BPH - symptomatic for awhile. Has frequency, urgency, incomplete bladder emptying, decreased stream, and nocturia.  All immunizations and health maintenance protocols were reviewed with the patient and needed orders were placed.  Osteoarthritis - in low back and right shoulder. Not present every day. We have done steroid injections but he reports that this has not worked. Had rotator cuff surgery on the right in the past.   Appropriate screening laboratory values were ordered for the patient including screening of hyperlipidemia, renal function and hepatic function. If indicated by BPH, a PSA was ordered.  Medication reconciliation,  past medical history, social history, problem list and allergies were reviewed in detail with the patient  Goals were established with regard to weight loss, exercise, and  diet in compliance with medications  He is up to date with routine colon cancer screening.  Review of Systems  Constitutional: Negative.   HENT: Negative.    Eyes: Negative.    Respiratory: Negative.    Cardiovascular: Negative.   Gastrointestinal: Negative.   Endocrine: Negative.   Genitourinary:  Positive for decreased urine volume, difficulty urinating, frequency and urgency.  Musculoskeletal:  Positive for arthralgias.  Skin: Negative.   Allergic/Immunologic: Negative.   Neurological: Negative.   Hematological: Negative.   Psychiatric/Behavioral: Negative.    All other systems reviewed and are negative. Past Medical History:  Diagnosis Date   Allergic rhinitis    COVID 01/12/2021   DJD (degenerative joint disease)    right thumb   DJD (degenerative joint disease) of knee    ED (erectile dysfunction)    Genital herpes    Insomnia    Seasonal allergies     Social History   Socioeconomic History   Marital status: Single    Spouse name: Not on file   Number of children: 2   Years of education: Not on file   Highest education level: Not on file  Occupational History   Occupation: Set designer: [employer]    Comment: [employer]  Tobacco Use   Smoking status: Never   Smokeless tobacco: Never  Substance and Sexual Activity   Alcohol use: No    Alcohol/week: 0.0 standard drinks   Drug use: No   Sexual activity: Not on file  Other Topics Concern   Not on file  Social History Narrative   He is not working    He is going back to school this fall for RT   Social Determinants of Health   Financial Resource Strain:  Not on file  Food Insecurity: Not on file  Transportation Needs: Not on file  Physical Activity: Not on file  Stress: Not on file  Social Connections: Not on file  Intimate Partner Violence: Not on file    Past Surgical History:  Procedure Laterality Date   COLONOSCOPY N/A 01/13/2015   Procedure: COLONOSCOPY;  Surgeon: Beverley Fiedler, MD;  Location: [facility];  Service: Gastroenterology;  Laterality: N/A;   KNEE SURGERY     s/p bilat knee x 5 total   ROTATOR CUFF REPAIR     s/p right rotator cuff tear    SHOULDER SURGERY     left shoulder tendon sugery   UMBILICAL HERNIA REPAIR     as infant    Family History  Problem Relation Age of Onset   Arthritis Mother    Hypertension Mother    Colon polyps Neg Hx    Colon cancer Neg Hx     Allergies  Allergen Reactions   Nsaids Other (See Comments)    brusing    Current Outpatient Medications on File Prior to Visit  Medication Sig Dispense Refill   acyclovir (ZOVIRAX) 200 MG capsule Take 1 capsule by mouth twice daily 180 capsule 0   benzonatate (TESSALON) 200 MG capsule Take 1 capsule (200 mg total) by mouth 2 (two) times daily as needed for cough. (Patient not taking: Reported on 02/09/2021) 20 capsule 0   No current facility-administered medications on file prior to visit.    BP (!) 142/80 (BP Location: Left Arm, Patient Position: Sitting, Cuff Size: Large)   Pulse 77   Temp 98.6 F (37 C) (Oral)   Ht 5\' 10"  (1.778 m)   Wt (!) 343 lb 12.8 oz (155.9 kg)   SpO2 96%   BMI 49.33 kg/m       Objective:   Physical Exam Vitals and nursing note reviewed.  Constitutional:      General: He is not in acute distress.    Appearance: Normal appearance. He is well-developed. He is obese.  HENT:     Head: Normocephalic and atraumatic.     Right Ear: Tympanic membrane, ear canal and external ear normal. There is no impacted cerumen.     Left Ear: Tympanic membrane, ear canal and external ear normal. There is no impacted cerumen.     Nose: Nose normal. No congestion or rhinorrhea.     Mouth/Throat:     Mouth: Mucous membranes are moist.     Pharynx: Oropharynx is clear. No oropharyngeal exudate or posterior oropharyngeal erythema.  Eyes:     General:        Right eye: No discharge.        Left eye: No discharge.     Extraocular Movements: Extraocular movements intact.     Conjunctiva/sclera: Conjunctivae normal.     Pupils: Pupils are equal, round, and reactive to light.  Neck:     Vascular: No carotid bruit.     Trachea: No  tracheal deviation.  Cardiovascular:     Rate and Rhythm: Normal rate and regular rhythm.     Pulses: Normal pulses.     Heart sounds: Normal heart sounds. No murmur heard.   No friction rub. No gallop.  Pulmonary:     Effort: Pulmonary effort is normal. No respiratory distress.     Breath sounds: Normal breath sounds. No stridor. No wheezing, rhonchi or rales.  Chest:     Chest wall: No tenderness.  Abdominal:  General: Bowel sounds are normal. There is no distension.     Palpations: Abdomen is soft. There is no mass.     Tenderness: There is no abdominal tenderness. There is no right CVA tenderness, left CVA tenderness, guarding or rebound.     Hernia: No hernia is present.  Musculoskeletal:        General: No swelling, tenderness, deformity or signs of injury. Normal range of motion.     Right lower leg: No edema.     Left lower leg: No edema.  Lymphadenopathy:     Cervical: No cervical adenopathy.  Skin:    General: Skin is warm and dry.     Capillary Refill: Capillary refill takes less than 2 seconds.     Coloration: Skin is not jaundiced or pale.     Findings: No bruising, erythema, lesion or rash.  Neurological:     General: No focal deficit present.     Mental Status: He is alert and oriented to person, place, and time.     Cranial Nerves: No cranial nerve deficit.     Sensory: No sensory deficit.     Motor: No weakness.     Coordination: Coordination normal.     Gait: Gait normal.     Deep Tendon Reflexes: Reflexes normal.  Psychiatric:        Mood and Affect: Mood normal.        Behavior: Behavior normal.        Thought Content: Thought content normal.        Judgment: Judgment normal.      Assessment & Plan:  1. Routine general medical examination at a health care facility - Follow up in one year or sooner if needed - Needs significant weight loss.  - CBC with Differential/Platelet; Future - Comprehensive metabolic panel; Future - Hemoglobin A1c;  Future - Lipid panel; Future - TSH; Future - TSH - Lipid panel - Hemoglobin A1c - Comprehensive metabolic panel - CBC with Differential/Platelet  2. Other obesity - Encouraged to get back into heart healthy diet and exercise  - CBC with Differential/Platelet; Future - Comprehensive metabolic panel; Future - Hemoglobin A1c; Future - Lipid panel; Future - TSH; Future - TSH - Lipid panel - Hemoglobin A1c - Comprehensive metabolic panel - CBC with Differential/Platelet  3. Herpes simplex virus (HSV) infection - Continue Acylovir   4. Benign prostatic hyperplasia with incomplete bladder emptying - Will trial him on Flomax. Side effects reviewed. Advised follow up if no improvement in the next 2-3 weeks  - PSA; Future - tamsulosin (FLOMAX) 0.4 MG CAPS capsule; Take 1 capsule (0.4 mg total) by mouth daily.  Dispense: 90 capsule; Refill: 1 - PSA  5. Flu vaccine need  - Flu Vaccine QUAD 6+ mos PF IM (Fluarix Quad PF)  6. Primary osteoarthritis involving multiple joints - Does not want to be referred to orthopedics at this time  - Advised voltaren gel   Shirline Frees, NP

## 2021-03-09 ENCOUNTER — Other Ambulatory Visit: Payer: Self-pay | Admitting: Adult Health

## 2021-03-09 DIAGNOSIS — Z0000 Encounter for general adult medical examination without abnormal findings: Secondary | ICD-10-CM

## 2021-03-09 DIAGNOSIS — B009 Herpesviral infection, unspecified: Secondary | ICD-10-CM

## 2021-04-14 ENCOUNTER — Other Ambulatory Visit: Payer: Self-pay | Admitting: Adult Health

## 2021-04-14 DIAGNOSIS — B009 Herpesviral infection, unspecified: Secondary | ICD-10-CM

## 2021-04-14 DIAGNOSIS — Z0000 Encounter for general adult medical examination without abnormal findings: Secondary | ICD-10-CM

## 2021-06-13 IMAGING — DX DG SHOULDER 2+V*R*
3 series · 3 of 3 positions shown · non-contrast
Comparison: None.

CLINICAL DATA: 58-year-old male with a history of right shoulder
pain. Prior rotator cuff repair

EXAM:
RIGHT SHOULDER - 2+ VIEW

[shoulder internal rotation ap]
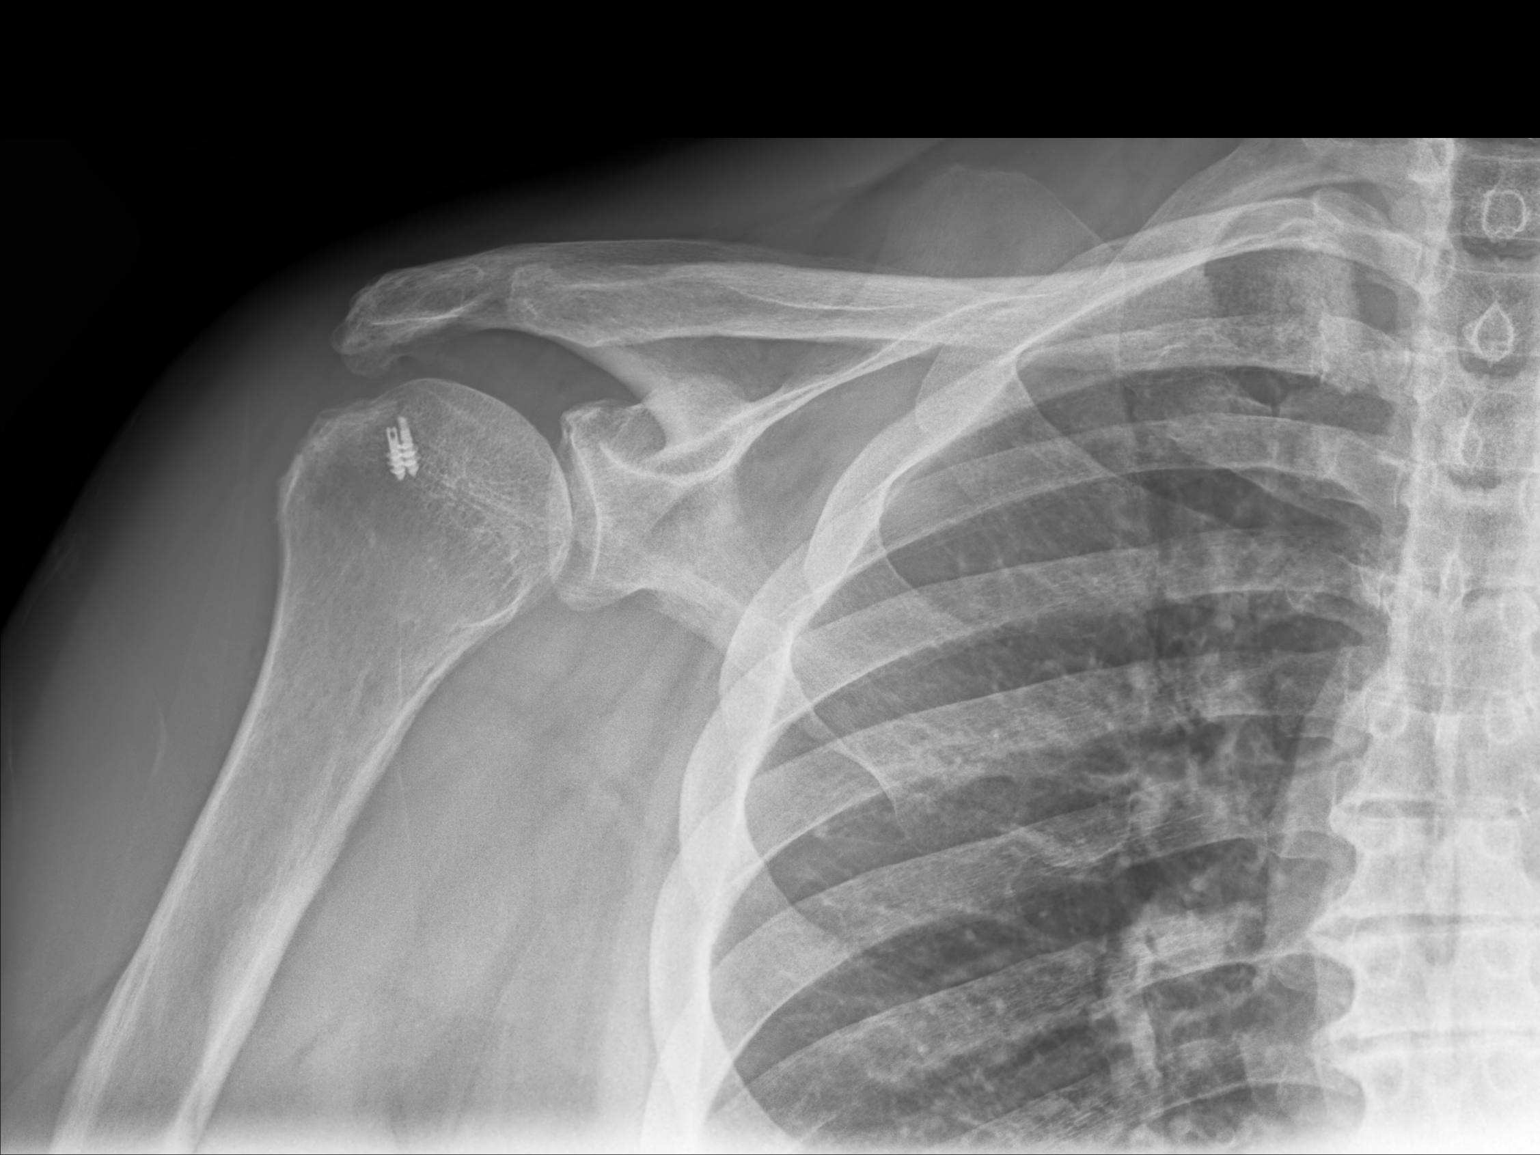

[shoulder external rotation ap]
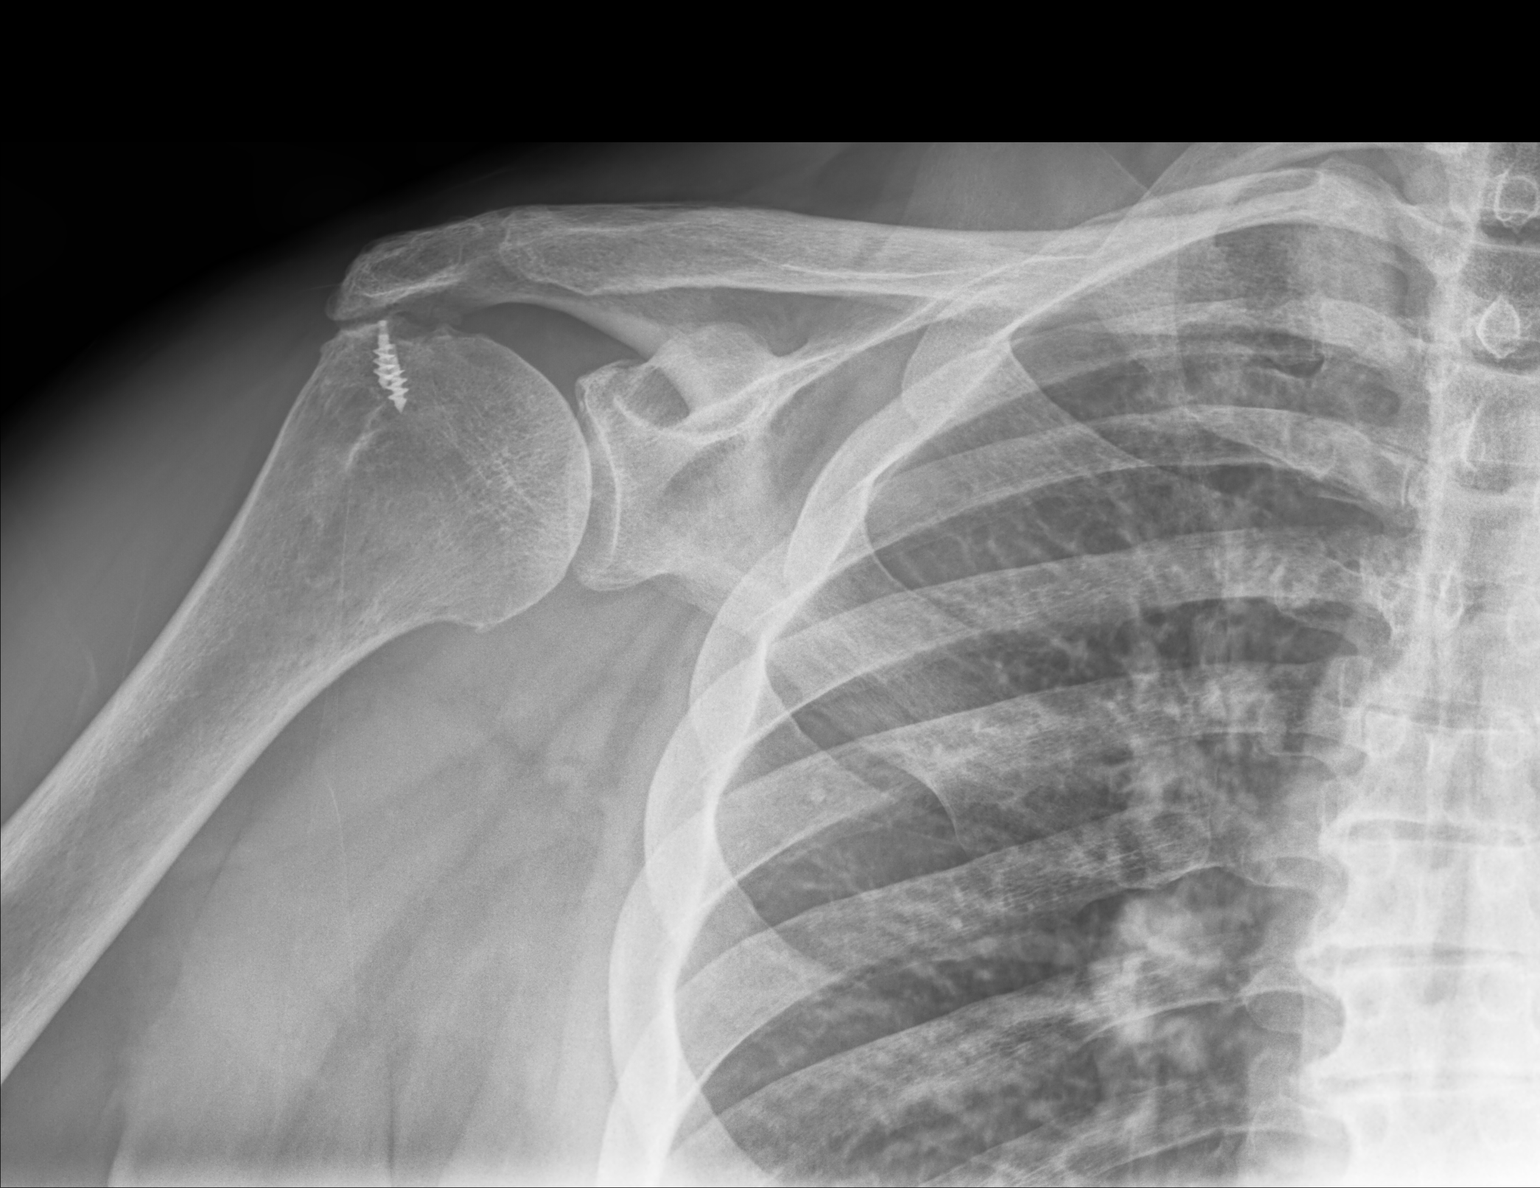

[shoulder (y view)]
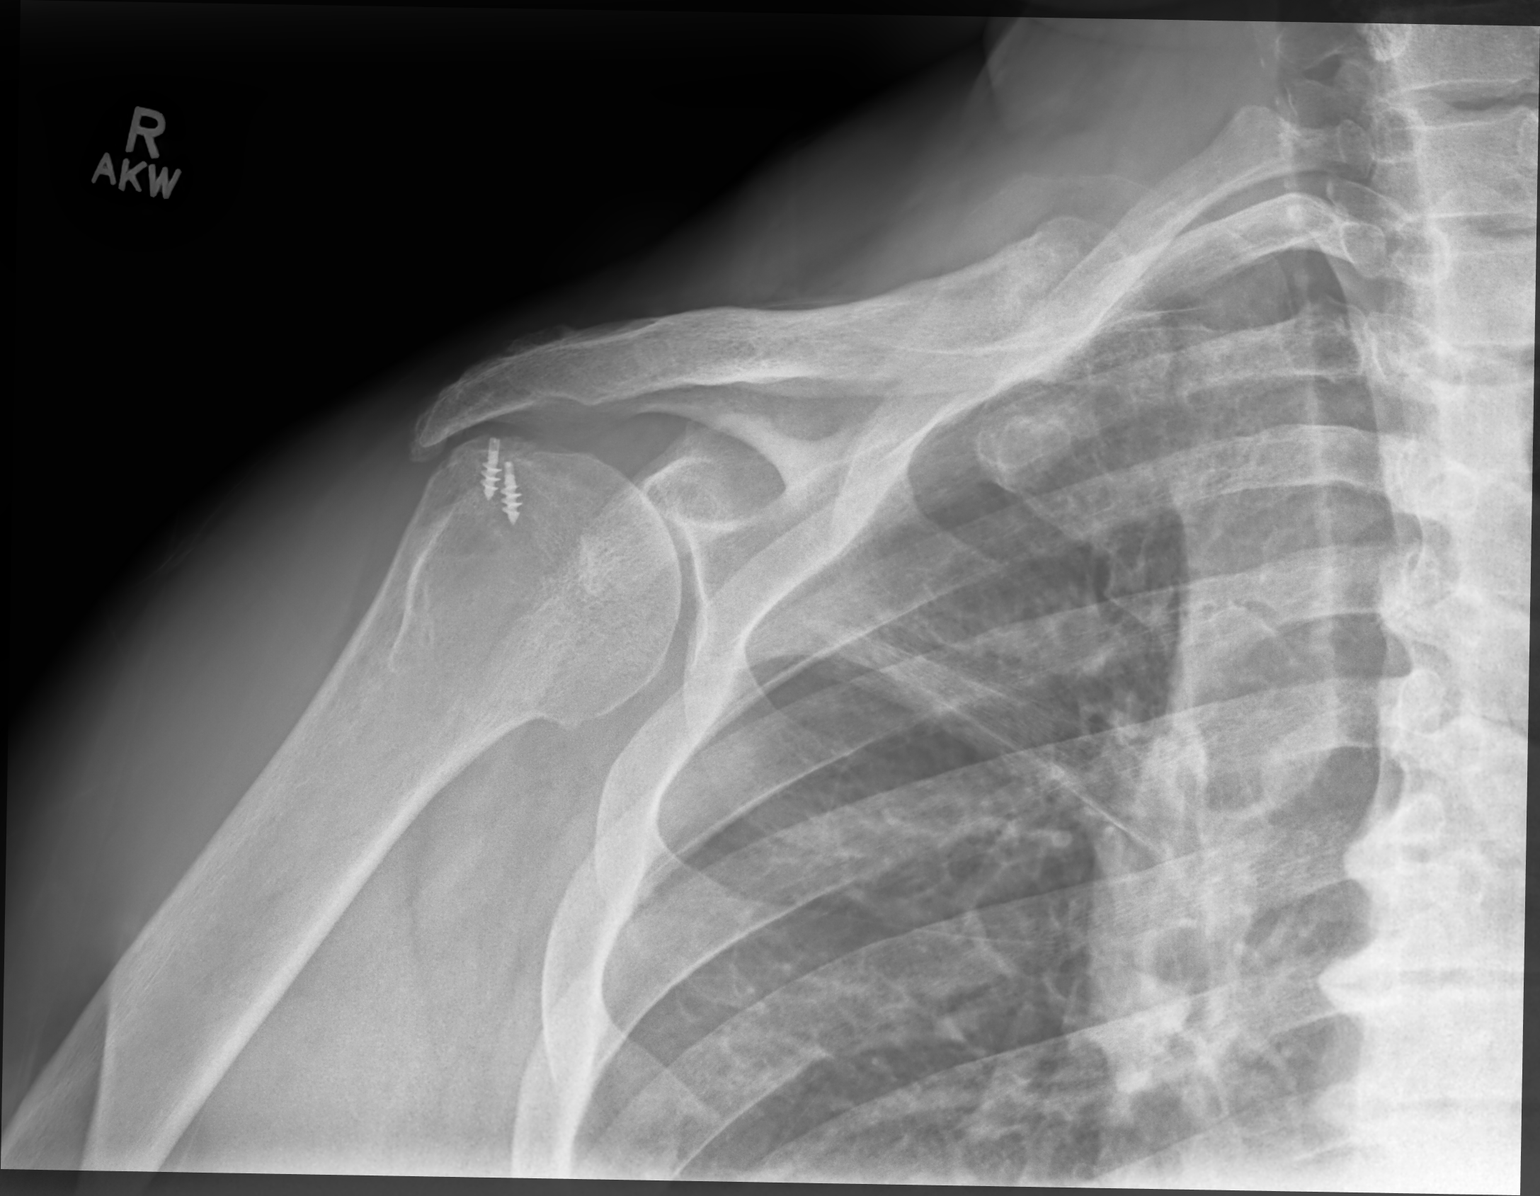

[3 of 3 positions shown; findings below may reference images not displayed]

FINDINGS: Glenohumeral joint appears congruent. Surgical changes of prior
rotator cuff repair.

No acute displaced fracture.

Subacromial spurring. Rounded calcific density superior to the
humeral head may reflect calcific tendinitis.

Degenerative changes at the AC joint.
IMPRESSION: Negative for acute fracture.

Subacromial spurring.

Questionable changes of calcific tendinitis.

Surgical changes of prior rotator cuff repair.

## 2021-06-22 ENCOUNTER — Telehealth: Payer: Self-pay | Admitting: Adult Health

## 2021-06-22 NOTE — Telephone Encounter (Signed)
Pt has been scheduled.  °

## 2021-06-22 NOTE — Telephone Encounter (Signed)
Routing to PCP

## 2021-06-22 NOTE — Telephone Encounter (Signed)
Spoke to pt and advised that Mychart nor the [facility] shows that he has gotten a Hep. Vaccine. PT is wanting to know if he could get set up for this vaccine. Advise that I will send to [facility] for advise. Pt is wanting vaccine for new job.  ?

## 2021-06-22 NOTE — Telephone Encounter (Signed)
Patient called in requesting a copy of his hepatitis vaccination records.  ? ?Patient would like to be contacted once the record is printed out. ? ?Please advise. ?

## 2021-06-24 ENCOUNTER — Other Ambulatory Visit: Payer: Self-pay | Admitting: Adult Health

## 2021-06-24 ENCOUNTER — Ambulatory Visit: Payer: 59

## 2021-06-24 NOTE — Addendum Note (Signed)
Addended by: Octavio Manns E on: 06/24/2021 02:42 PM ? ? Modules accepted: Orders ? ?

## 2021-06-25 ENCOUNTER — Telehealth: Payer: Self-pay | Admitting: Adult Health

## 2021-06-25 LAB — HEPATITIS PANEL, ACUTE
Hep A IgM: NONREACTIVE
Hep B C IgM: NONREACTIVE
Hepatitis B Surface Ag: NONREACTIVE
Hepatitis C Ab: NONREACTIVE
SIGNAL TO CUT-OFF: <0.02 (ref <1.00)

## 2021-06-25 NOTE — Telephone Encounter (Signed)
Pt called wanting a TB test and stated that the nurse told him he needed one every 10 years his last one was 06/2011. I asked pt if he meant the TDAP vaccine. The nurse stated every 10 years and that's when he got his last TDAP.  Pt stated he needed the TB and needed this done every 10 years. Pt wanted to know when he could get it done. I advised pt that he could come in next week and he will need to come in Monday-Wednesday to have the test read. Pt stated that he will do this test at work and opt for the TDAP. Pt has been scheduled for TDAP.  ?

## 2021-06-25 NOTE — Telephone Encounter (Signed)
Pt had last tdap on 06-15-2011. Does pt needs order? Pt has new job and they would like his Tdap record ?

## 2021-06-28 ENCOUNTER — Telehealth: Payer: Self-pay | Admitting: Adult Health

## 2021-06-28 NOTE — Telephone Encounter (Signed)
Pt is calling and would like results of hep panel ?

## 2021-06-29 NOTE — Telephone Encounter (Signed)
This has been taking care of.

## 2021-07-01 ENCOUNTER — Ambulatory Visit (INDEPENDENT_AMBULATORY_CARE_PROVIDER_SITE_OTHER): Payer: 59

## 2021-07-01 DIAGNOSIS — Z23 Encounter for immunization: Secondary | ICD-10-CM

## 2021-07-01 NOTE — Progress Notes (Signed)
.  twinrix given in right deltoid patient tolerated injection well.  ? ?

## 2021-07-27 ENCOUNTER — Other Ambulatory Visit: Payer: Self-pay | Admitting: Adult Health

## 2021-07-27 DIAGNOSIS — B009 Herpesviral infection, unspecified: Secondary | ICD-10-CM

## 2021-07-27 DIAGNOSIS — Z0000 Encounter for general adult medical examination without abnormal findings: Secondary | ICD-10-CM

## 2021-08-02 ENCOUNTER — Ambulatory Visit (INDEPENDENT_AMBULATORY_CARE_PROVIDER_SITE_OTHER): Payer: 59

## 2021-08-02 DIAGNOSIS — Z23 Encounter for immunization: Secondary | ICD-10-CM | POA: Diagnosis not present

## 2021-08-02 NOTE — Progress Notes (Signed)
Tyler Larson is a 61 y.o. male presents to the office today for Tdap & Twinrix #2 of 3 injections, per physician's orders. ?Tdap given in left deltoid, Twinrix given in right deltoid ? Patient tolerated injection. ?Patient due for follow up labs/provider appt: No. ?Patient next injection due: 01/02/22, appt made Yes ? ?Tyler Larson  ?

## 2022-01-03 ENCOUNTER — Ambulatory Visit: Payer: 59

## 2022-04-27 ENCOUNTER — Telehealth: Payer: Self-pay | Admitting: Adult Health

## 2022-04-27 NOTE — Telephone Encounter (Deleted)
Prescription Request  04/27/2022  Is this a "Controlled Substance" medicine? No  LOV: Visit date not found  What is the name of the medication or equipment?   Disp Refills Start End   tamsulosin (FLOMAX) 0.4 MG CAPS capsule        Have you contacted your pharmacy to request a refill? No   Which pharmacy would you like this sent to?  [facility] ([place]), [place] - [address] [place] [address] Phone: [phone] Fax: [phone]    Patient notified that their request is being sent to the clinical staff for review and that they should receive a response within 2 business days.   Please advise at Mobile There is no such number on file (mobile).

## 2022-05-02 ENCOUNTER — Ambulatory Visit: Payer: 59

## 2022-05-03 ENCOUNTER — Encounter: Payer: Self-pay | Admitting: Adult Health

## 2022-06-07 ENCOUNTER — Encounter: Payer: Self-pay | Admitting: Adult Health

## 2022-06-13 ENCOUNTER — Other Ambulatory Visit: Payer: Self-pay | Admitting: Adult Health

## 2022-06-13 DIAGNOSIS — B009 Herpesviral infection, unspecified: Secondary | ICD-10-CM

## 2022-06-13 DIAGNOSIS — Z0000 Encounter for general adult medical examination without abnormal findings: Secondary | ICD-10-CM

## 2022-06-16 NOTE — Telephone Encounter (Signed)
error 

## 2022-07-19 ENCOUNTER — Encounter: Payer: Self-pay | Admitting: Adult Health

## 2022-08-23 ENCOUNTER — Ambulatory Visit (INDEPENDENT_AMBULATORY_CARE_PROVIDER_SITE_OTHER): Payer: Self-pay | Admitting: Adult Health

## 2022-08-23 ENCOUNTER — Encounter: Payer: Self-pay | Admitting: Adult Health

## 2022-08-23 VITALS — BP 122/88 | HR 65 | Temp 98.4°F | Ht 70.0 in | Wt 295.0 lb

## 2022-08-23 DIAGNOSIS — G8929 Other chronic pain: Secondary | ICD-10-CM

## 2022-08-23 DIAGNOSIS — Z0000 Encounter for general adult medical examination without abnormal findings: Secondary | ICD-10-CM

## 2022-08-23 DIAGNOSIS — E668 Other obesity: Secondary | ICD-10-CM

## 2022-08-23 DIAGNOSIS — R3914 Feeling of incomplete bladder emptying: Secondary | ICD-10-CM

## 2022-08-23 DIAGNOSIS — H05229 Edema of unspecified orbit: Secondary | ICD-10-CM

## 2022-08-23 DIAGNOSIS — N401 Enlarged prostate with lower urinary tract symptoms: Secondary | ICD-10-CM

## 2022-08-23 DIAGNOSIS — M25511 Pain in right shoulder: Secondary | ICD-10-CM

## 2022-08-23 DIAGNOSIS — R7303 Prediabetes: Secondary | ICD-10-CM

## 2022-08-23 DIAGNOSIS — B009 Herpesviral infection, unspecified: Secondary | ICD-10-CM

## 2022-08-23 LAB — PSA: PSA: 0.71 ng/mL (ref 0.10–4.00)

## 2022-08-23 LAB — CBC
HCT: 46.9 % (ref 39.0–52.0)
Hemoglobin: 15.4 g/dL (ref 13.0–17.0)
MCHC: 32.9 g/dL (ref 30.0–36.0)
MCV: 86.5 fl (ref 78.0–100.0)
Platelets: 183 10*3/uL (ref 150.0–400.0)
RBC: 5.42 Mil/uL (ref 4.22–5.81)
RDW: 14.3 % (ref 11.5–15.5)
WBC: 5.8 10*3/uL (ref 4.0–10.5)

## 2022-08-23 LAB — COMPREHENSIVE METABOLIC PANEL
ALT: 13 U/L (ref 0–53)
AST: 13 U/L (ref 0–37)
Albumin: 3.8 g/dL (ref 3.5–5.2)
Alkaline Phosphatase: 49 U/L (ref 39–117)
BUN: 22 mg/dL (ref 6–23)
CO2: 26 mEq/L (ref 19–32)
Calcium: 8.9 mg/dL (ref 8.4–10.5)
Chloride: 105 mEq/L (ref 96–112)
Creatinine, Ser: 1.16 mg/dL (ref 0.40–1.50)
GFR: 67.43 mL/min (ref >60.00)
Glucose, Bld: 84 mg/dL (ref 70–99)
Potassium: 4.5 mEq/L (ref 3.5–5.1)
Sodium: 140 mEq/L (ref 135–145)
Total Bilirubin: 0.9 mg/dL (ref 0.2–1.2)
Total Protein: 6.9 g/dL (ref 6.0–8.3)

## 2022-08-23 LAB — LIPID PANEL
Cholesterol: 208 mg/dL — ABNORMAL HIGH (ref 0–200)
HDL: 55.2 mg/dL (ref >39.00)
LDL Cholesterol: 142 mg/dL — ABNORMAL HIGH (ref 0–99)
NonHDL: 153.28
Total CHOL/HDL Ratio: 4
Triglycerides: 58 mg/dL (ref 0.0–149.0)
VLDL: 11.6 mg/dL (ref 0.0–40.0)

## 2022-08-23 LAB — TSH: TSH: 0.69 u[IU]/mL (ref 0.35–5.50)

## 2022-08-23 LAB — HEMOGLOBIN A1C: Hgb A1c MFr Bld: 5.7 % (ref 4.6–6.5)

## 2022-08-23 NOTE — Progress Notes (Signed)
Subjective:    Patient ID: Tyler Larson, male    DOB: May 26, 1960, 62 y.o.   MRN: [id number]  HPI Patient presents for yearly preventative medicine examination. He is a pleasant 62 year old male who  has a past medical history of Allergic rhinitis, COVID (01/12/2021), DJD (degenerative joint disease), DJD (degenerative joint disease) of knee, ED (erectile dysfunction), Genital herpes, Insomnia, and Seasonal allergies.  Obesity  He has been working hard on weight loss since the last time I saw him in 02/2021. He has been able to lose 48 pounds  by eating healthy, doing intermittent fasting, walking and weight lifting. Wt Readings from Last 3 Encounters:  08/23/22 295 lb (133.8 kg)  02/09/21 (!) 343 lb 12.8 oz (155.9 kg)  08/16/19 (!) 335 lb (152 kg)   Right Shoulder pain - history of rotator cuff surgery in the past. Since he has been lifting weights he has noticed a clicking and popping sensation in his right shoulder and has some decreased grip strength. He has no loss of ROM. Last Xray in 2021 showed IMPRESSION: Negative for acute fracture. Subacromial spurring. Questionable changes of calcific tendinitis. Surgical changes of prior rotator cuff repair.    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  Denies any recent flares  BPH - he has not been using Flomax as his prescription ran out. He has been using saw palmetto and has noticed that he is not urinating as much.   Prediabetes - not currently on medication  Lab Results  Component Value Date   HGBA1C 6.0 02/09/2021   Right orbit swelling - he has had a swollen area in the upper medial aspect of his right orbit for sometime now, he feels as though this area is getting larger. He denies pain. He does not have an eye doctor.   All immunizations and health maintenance protocols were reviewed with the patient and needed orders were placed.  Appropriate screening laboratory values were ordered for the patient  including screening of hyperlipidemia, renal function and hepatic function. If indicated by BPH, a PSA was ordered.  Medication reconciliation,  past medical history, social history, problem list and allergies were reviewed in detail with the patient  Goals were established with regard to weight loss, exercise, and  diet in compliance with medications  Review of Systems  Constitutional: Negative.   HENT: Negative.    Eyes: Negative.   Respiratory: Negative.    Cardiovascular: Negative.   Gastrointestinal: Negative.   Endocrine: Negative.   Genitourinary: Negative.   Musculoskeletal: Negative.   Skin: Negative.   Allergic/Immunologic: Negative.   Neurological: Negative.   Hematological: Negative.   Psychiatric/Behavioral: Negative.    All other systems reviewed and are negative.  Past Medical History:  Diagnosis Date   Allergic rhinitis    COVID 01/12/2021   DJD (degenerative joint disease)    right thumb   DJD (degenerative joint disease) of knee    ED (erectile dysfunction)    Genital herpes    Insomnia    Seasonal allergies     Social History   Socioeconomic History   Marital status: Single    Spouse name: Not on file   Number of children: 2   Years of education: Not on file   Highest education level: Not on file  Occupational History   Occupation: Set designer: [employer]    Comment: [employer]  Tobacco Use   Smoking status: Never   Smokeless tobacco:  Never  Substance and Sexual Activity   Alcohol use: No    Alcohol/week: 0.0 standard drinks of alcohol   Drug use: No   Sexual activity: Not on file  Other Topics Concern   Not on file  Social History Narrative   He is not working    He is going back to school this fall for RT   Social Determinants of Health   Financial Resource Strain: Not on file  Food Insecurity: Not on file  Transportation Needs: Not on file  Physical Activity: Not on file  Stress: Not on file  Social  Connections: Not on file  Intimate Partner Violence: Not on file    Past Surgical History:  Procedure Laterality Date   COLONOSCOPY N/A 01/13/2015   Procedure: COLONOSCOPY;  Surgeon: Beverley Fiedler, MD;  Location: [facility];  Service: Gastroenterology;  Laterality: N/A;   KNEE SURGERY     s/p bilat knee x 5 total   ROTATOR CUFF REPAIR     s/p right rotator cuff tear   SHOULDER SURGERY     left shoulder tendon sugery   UMBILICAL HERNIA REPAIR     as infant    Family History  Problem Relation Age of Onset   Arthritis Mother    Hypertension Mother    Colon polyps Neg Hx    Colon cancer Neg Hx     Allergies  Allergen Reactions   Nsaids Other (See Comments)    brusing    Current Outpatient Medications on File Prior to Visit  Medication Sig Dispense Refill   acyclovir (ZOVIRAX) 200 MG capsule Take 1 capsule by mouth twice daily 180 capsule 0   cholecalciferol (VITAMIN D3) 25 MCG (1000 UNIT) tablet Take 1,000 Units by mouth daily.     Multiple Vitamin (MULTIVITAMIN) capsule Take 1 capsule by mouth daily.     tamsulosin (FLOMAX) 0.4 MG CAPS capsule Take 1 capsule (0.4 mg total) by mouth daily. (Patient not taking: Reported on 08/23/2022) 90 capsule 1   No current facility-administered medications on file prior to visit.    BP 122/88   Pulse 65   Temp 98.4 F (36.9 C) (Oral)   Ht 5\' 10"  (1.778 m)   Wt 295 lb (133.8 kg)   SpO2 98%   BMI 42.33 kg/m       Objective:   Physical Exam Vitals and nursing note reviewed.  Constitutional:      General: He is not in acute distress.    Appearance: Normal appearance. He is not ill-appearing.  HENT:     Head: Normocephalic and atraumatic.     Right Ear: Tympanic membrane, ear canal and external ear normal. There is no impacted cerumen.     Left Ear: Tympanic membrane, ear canal and external ear normal. There is no impacted cerumen.     Nose: Nose normal. No congestion or rhinorrhea.     Mouth/Throat:     Mouth: Mucous  membranes are moist.     Pharynx: Oropharynx is clear.  Eyes:     Extraocular Movements: Extraocular movements intact.     Conjunctiva/sclera: Conjunctivae normal.     Pupils: Pupils are equal, round, and reactive to light.   Neck:     Vascular: No carotid bruit.  Cardiovascular:     Rate and Rhythm: Normal rate and regular rhythm.     Pulses: Normal pulses.     Heart sounds: No murmur heard.    No friction rub. No gallop.  Pulmonary:  Effort: Pulmonary effort is normal.     Breath sounds: Normal breath sounds.  Abdominal:     General: Abdomen is flat. Bowel sounds are normal. There is no distension.     Palpations: Abdomen is soft. There is no mass.     Tenderness: There is no abdominal tenderness. There is no guarding or rebound.     Hernia: No hernia is present.  Musculoskeletal:        General: Normal range of motion.     Right shoulder: Crepitus present. No tenderness or bony tenderness. Normal range of motion. Decreased strength.     Cervical back: Normal range of motion and neck supple.  Lymphadenopathy:     Cervical: No cervical adenopathy.  Skin:    General: Skin is warm and dry.     Capillary Refill: Capillary refill takes less than 2 seconds.  Neurological:     General: No focal deficit present.     Mental Status: He is alert and oriented to person, place, and time.  Psychiatric:        Mood and Affect: Mood normal.        Behavior: Behavior normal.        Thought Content: Thought content normal.        Judgment: Judgment normal.       Assessment & Plan:  1. Routine general medical examination at a health care facility Today patient counseled on age appropriate routine health concerns for screening and prevention, each reviewed and up to date or declined. Immunizations reviewed and up to date or declined. Labs ordered and reviewed. Risk factors for depression reviewed and negative. Hearing function and visual acuity are intact. ADLs screened and addressed as  needed. Functional ability and level of safety reviewed and appropriate. Education, counseling and referrals performed based on assessed risks today. Patient provided with a copy of personalized plan for preventive services. - Will have him get shingles vaccination at pharmacy   2. Other obesity - He has done well and was congratulated  - Continue with weight loss measures  - Lipid panel; Future - TSH; Future - CBC; Future - Comprehensive metabolic panel; Future - Hemoglobin A1c; Future  3. Herpes simplex virus (HSV) infection - Continue Acyclovir as needed  4. Benign prostatic hyperplasia with incomplete bladder emptying  - PSA; Future  5. Prediabetes - likely improved with weight loss  - Lipid panel; Future - TSH; Future - CBC; Future - Comprehensive metabolic panel; Future - Hemoglobin A1c; Future  6. Orbital swelling - Will refer to optometry to start  - Ambulatory referral to Optometry  7. Chronic right shoulder pain - He would like to hold off for now   Shirline Frees, NP

## 2022-08-23 NOTE — Patient Instructions (Signed)
It was great seeing you today   We will follow up with you regarding your lab work   Please let me know if you need anything   

## 2022-11-10 ENCOUNTER — Other Ambulatory Visit: Payer: Self-pay | Admitting: Adult Health

## 2022-11-10 DIAGNOSIS — B009 Herpesviral infection, unspecified: Secondary | ICD-10-CM

## 2022-11-10 DIAGNOSIS — Z0000 Encounter for general adult medical examination without abnormal findings: Secondary | ICD-10-CM

## 2023-04-18 ENCOUNTER — Other Ambulatory Visit: Payer: Self-pay | Admitting: Adult Health

## 2023-04-18 DIAGNOSIS — B009 Herpesviral infection, unspecified: Secondary | ICD-10-CM

## 2023-04-18 DIAGNOSIS — Z0000 Encounter for general adult medical examination without abnormal findings: Secondary | ICD-10-CM

## 2023-07-20 ENCOUNTER — Other Ambulatory Visit: Payer: Self-pay | Admitting: Adult Health

## 2023-07-20 DIAGNOSIS — Z0000 Encounter for general adult medical examination without abnormal findings: Secondary | ICD-10-CM

## 2023-07-20 DIAGNOSIS — B009 Herpesviral infection, unspecified: Secondary | ICD-10-CM

## 2023-07-20 MED ORDER — ACYCLOVIR 200 MG PO CAPS
200.0000 mg | ORAL_CAPSULE | Freq: Two times a day (BID) | ORAL | 0 refills | Status: DC
Start: 2023-07-20 — End: 2023-08-24

## 2023-07-20 NOTE — Telephone Encounter (Signed)
 Copied from CRM [id number]. Topic: Clinical - Medication Refill >> Jul 20, 2023 10:41 AM Denese Killings wrote: Most Recent Primary Care Visit:  Provider: Shirline Frees  Department: [facility]  Visit Type: PHYSICAL  Date: 08/23/2022  Medication: acyclovir (ZOVIRAX) 200 MG capsule  Has the patient contacted their pharmacy? No (Agent: If no, request that the patient contact the pharmacy for the refill. If patient does not wish to contact the pharmacy document the reason why and proceed with request.) (Agent: If yes, when and what did the pharmacy advise?) new pharmacy needs prescription moved there  Is this the correct pharmacy for this prescription? Yes If no, delete pharmacy and type the correct one.  This is the patient's preferred pharmacy:  [facility] [place] [place] - [place] - [address] [place] [address] Phone: [phone] Fax: [phone]   Has the prescription been filled recently? No  Is the patient out of the medication? Yes  Has the patient been seen for an appointment in the last year OR does the patient have an upcoming appointment? Yes  Can we respond through MyChart? No  Agent: Please be advised that Rx refills may take up to 3 business days. We ask that you follow-up with your pharmacy.

## 2023-08-24 ENCOUNTER — Ambulatory Visit: Payer: Self-pay | Admitting: Adult Health

## 2023-08-24 ENCOUNTER — Encounter: Payer: Self-pay | Admitting: Adult Health

## 2023-08-24 ENCOUNTER — Ambulatory Visit (INDEPENDENT_AMBULATORY_CARE_PROVIDER_SITE_OTHER): Payer: Self-pay | Admitting: Adult Health

## 2023-08-24 VITALS — BP 120/88 | HR 77 | Temp 98.4°F | Ht 69.25 in | Wt 272.0 lb

## 2023-08-24 DIAGNOSIS — R3914 Feeling of incomplete bladder emptying: Secondary | ICD-10-CM

## 2023-08-24 DIAGNOSIS — B009 Herpesviral infection, unspecified: Secondary | ICD-10-CM | POA: Diagnosis not present

## 2023-08-24 DIAGNOSIS — E66812 Obesity, class 2: Secondary | ICD-10-CM

## 2023-08-24 DIAGNOSIS — H05229 Edema of unspecified orbit: Secondary | ICD-10-CM | POA: Diagnosis not present

## 2023-08-24 DIAGNOSIS — R7303 Prediabetes: Secondary | ICD-10-CM

## 2023-08-24 DIAGNOSIS — Z0000 Encounter for general adult medical examination without abnormal findings: Secondary | ICD-10-CM

## 2023-08-24 DIAGNOSIS — N401 Enlarged prostate with lower urinary tract symptoms: Secondary | ICD-10-CM

## 2023-08-24 DIAGNOSIS — Z6839 Body mass index (BMI) 39.0-39.9, adult: Secondary | ICD-10-CM | POA: Diagnosis not present

## 2023-08-24 LAB — COMPREHENSIVE METABOLIC PANEL WITH GFR
ALT: 12 U/L (ref 0–53)
AST: 14 U/L (ref 0–37)
Albumin: 3.9 g/dL (ref 3.5–5.2)
Alkaline Phosphatase: 51 U/L (ref 39–117)
BUN: 14 mg/dL (ref 6–23)
CO2: 29 meq/L (ref 19–32)
Calcium: 9.2 mg/dL (ref 8.4–10.5)
Chloride: 103 meq/L (ref 96–112)
Creatinine, Ser: 1.23 mg/dL (ref 0.40–1.50)
GFR: 62.41 mL/min (ref >60.00)
Glucose, Bld: 88 mg/dL (ref 70–99)
Potassium: 5.1 meq/L (ref 3.5–5.1)
Sodium: 140 meq/L (ref 135–145)
Total Bilirubin: 1 mg/dL (ref 0.2–1.2)
Total Protein: 6.8 g/dL (ref 6.0–8.3)

## 2023-08-24 LAB — CBC
HCT: 43.8 % (ref 39.0–52.0)
Hemoglobin: 14.1 g/dL (ref 13.0–17.0)
MCHC: 32.3 g/dL (ref 30.0–36.0)
MCV: 85.4 fl (ref 78.0–100.0)
Platelets: 187 10*3/uL (ref 150.0–400.0)
RBC: 5.13 Mil/uL (ref 4.22–5.81)
RDW: 15.6 % — ABNORMAL HIGH (ref 11.5–15.5)
WBC: 6.5 10*3/uL (ref 4.0–10.5)

## 2023-08-24 LAB — PSA: PSA: 1.13 ng/mL (ref 0.10–4.00)

## 2023-08-24 LAB — LIPID PANEL
Cholesterol: 234 mg/dL — ABNORMAL HIGH (ref 0–200)
HDL: 54 mg/dL (ref >39.00)
LDL Cholesterol: 167 mg/dL — ABNORMAL HIGH (ref 0–99)
NonHDL: 180.22
Total CHOL/HDL Ratio: 4
Triglycerides: 68 mg/dL (ref 0.0–149.0)
VLDL: 13.6 mg/dL (ref 0.0–40.0)

## 2023-08-24 LAB — TSH: TSH: 1.12 u[IU]/mL (ref 0.35–5.50)

## 2023-08-24 LAB — HEMOGLOBIN A1C: Hgb A1c MFr Bld: 6 % (ref 4.6–6.5)

## 2023-08-24 MED ORDER — ACYCLOVIR 200 MG PO CAPS: 200.0000 mg | ORAL_CAPSULE | Freq: Two times a day (BID) | ORAL | 3 refills | Status: AC

## 2023-08-24 MED ORDER — TAMSULOSIN HCL 0.4 MG PO CAPS: 0.4000 mg | ORAL_CAPSULE | Freq: Every day | ORAL | 3 refills | Status: AC

## 2023-08-24 NOTE — Patient Instructions (Signed)
 It was great seeing you today   We will follow up with you regarding your lab work   Please let me know if you need anything

## 2023-08-24 NOTE — Progress Notes (Signed)
 Subjective:    Patient ID: Tyler Larson, male    DOB: Oct 24, 1960, 63 y.o.   MRN: [id number]  HPI Patient presents for yearly preventative medicine examination. He is a pleasant 63 year old male who  has a past medical history of Allergic rhinitis, COVID (01/12/2021), DJD (degenerative joint disease), DJD (degenerative joint disease) of knee, ED (erectile dysfunction), Genital herpes, Insomnia, and Seasonal allergies.  Obesity  He has been working hard on weight loss since the last time I saw him in 02/2021. He has been able to lose 65 pounds  by eating healthy, doing intermittent fasting, walking and weight lifting. 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  Denies any recent flares  BPH - Has nocturia and and decreased stream. Has used flomax  in the past and would like to go back on this  Prediabetes - not currently on medication  Lab Results  Component Value Date   HGBA1C 5.7 08/23/2022   HGBA1C 6.0 02/09/2021   HGBA1C 5.7 07/25/2019   Swelling to right upper eye lid - he has had a swollen area in the upper medial aspect of his right orbit for sometime now, he feels as though this area is getting larger. He denies pain. He does not have an eye doctor. He was referred to optometry last year but never went   All immunizations and health maintenance protocols were reviewed with the patient and needed orders were placed.  Appropriate screening laboratory values were ordered for the patient including screening of hyperlipidemia, renal function and hepatic function. If indicated by BPH, a PSA was ordered.  Medication reconciliation,  past medical history, social history, problem list and allergies were reviewed in detail with the patient  Goals were established with regard to weight loss, exercise, and  diet in compliance with  medications  He is up to date on routine colon cancer screening   Review of Systems  Constitutional: Negative.   HENT: Negative.    Eyes: Negative.   Respiratory: Negative.    Cardiovascular: Negative.   Gastrointestinal: Negative.   Endocrine: Negative.   Genitourinary: Negative.   Musculoskeletal: Negative.   Skin: Negative.   Allergic/Immunologic: Negative.   Neurological: Negative.   Hematological: Negative.   Psychiatric/Behavioral: Negative.    All other systems reviewed and are negative.  Past Medical History:  Diagnosis Date   Allergic rhinitis    COVID 01/12/2021   DJD (degenerative joint disease)    right thumb   DJD (degenerative joint disease) of knee    ED (erectile dysfunction)    Genital herpes    Insomnia    Seasonal allergies     Social History   Socioeconomic History   Marital status: Single    Spouse name: Not on file   Number of children: 2   Years of education: Not on file   Highest education level: Not on file  Occupational History   Occupation: Set designer: [employer]    Comment: [employer]  Tobacco Use   Smoking status: Never   Smokeless tobacco: Never  Substance and Sexual Activity   Alcohol use: No    Alcohol/week: 0.0 standard drinks of alcohol   Drug use: No   Sexual activity: Not on file  Other Topics Concern   Not on file  Social  History Narrative   He is not working    He is going back to school this fall for RT   Social Drivers of Corporate investment banker Strain: Not on file  Food Insecurity: Not on file  Transportation Needs: Not on file  Physical Activity: Not on file  Stress: Not on file  Social Connections: Unknown (08/24/2021)   Received from [facility], [facility]   Social Network    Social Network: Not on file  Intimate Partner Violence: Unknown (07/15/2021)   Received from [facility], [facility]   HITS    Physically Hurt: Not on file    Insult or Talk Down To: Not on file     Threaten Physical Harm: Not on file    Scream or Curse: Not on file    Past Surgical History:  Procedure Laterality Date   COLONOSCOPY N/A 01/13/2015   Procedure: COLONOSCOPY;  Surgeon: Nannette Babe, MD;  Location: [facility]COPY;  Service: Gastroenterology;  Laterality: N/A;   KNEE SURGERY     s/p bilat knee x 5 total   ROTATOR CUFF REPAIR     s/p right rotator cuff tear   SHOULDER SURGERY     left shoulder tendon sugery   UMBILICAL HERNIA REPAIR     as infant    Family History  Problem Relation Age of Onset   Arthritis Mother    Hypertension Mother    Colon polyps Neg Hx    Colon cancer Neg Hx     Allergies  Allergen Reactions   Nsaids Other (See Comments)    brusing    Current Outpatient Medications on File Prior to Visit  Medication Sig Dispense Refill   cholecalciferol (VITAMIN D3) 25 MCG (1000 UNIT) tablet Take 1,000 Units by mouth daily.     Multiple Vitamin (MULTIVITAMIN) capsule Take 1 capsule by mouth daily.     No current facility-administered medications on file prior to visit.    BP 120/88   Pulse 77   Temp 98.4 F (36.9 C) (Oral)   Ht 5' 9.25" (1.759 m)   Wt 272 lb (123.4 kg)   SpO2 99%   BMI 39.88 kg/m       Objective:   Physical Exam Vitals and nursing note reviewed.  Constitutional:      General: He is not in acute distress.    Appearance: Normal appearance. He is obese. He is not ill-appearing.  HENT:     Head: Normocephalic and atraumatic.     Right Ear: Tympanic membrane, ear canal and external ear normal. There is no impacted cerumen.     Left Ear: Tympanic membrane, ear canal and external ear normal. There is no impacted cerumen.     Nose: Nose normal. No congestion or rhinorrhea.     Mouth/Throat:     Mouth: Mucous membranes are moist.     Pharynx: Oropharynx is clear.  Eyes:     Extraocular Movements: Extraocular movements intact.     Conjunctiva/sclera: Conjunctivae normal.     Pupils: Pupils are equal, round, and reactive to  light.   Neck:     Vascular: No carotid bruit.  Cardiovascular:     Rate and Rhythm: Normal rate and regular rhythm.     Pulses: Normal pulses.     Heart sounds: No murmur heard.    No friction rub. No gallop.  Pulmonary:     Effort: Pulmonary effort is normal.     Breath sounds: Normal breath sounds.  Abdominal:     General: Abdomen is flat. Bowel sounds are normal. There is no distension.     Palpations: Abdomen is soft. There is no mass.     Tenderness: There is no abdominal tenderness. There is no guarding or rebound.     Hernia: No hernia is present.  Musculoskeletal:        General: Normal range of motion.     Cervical back: Normal range of motion and neck supple.  Lymphadenopathy:     Cervical: No cervical adenopathy.  Skin:    General: Skin is warm and dry.     Capillary Refill: Capillary refill takes less than 2 seconds.  Neurological:     General: No focal deficit present.     Mental Status: He is alert and oriented to person, place, and time.  Psychiatric:        Mood and Affect: Mood normal.        Behavior: Behavior normal.        Thought Content: Thought content normal.        Judgment: Judgment normal.       Assessment & Plan:  1. Routine general medical examination at a health care facility (Primary) Today patient counseled on age appropriate routine health concerns for screening and prevention, each reviewed and up to date or declined. Immunizations reviewed and up to date or declined. Labs ordered and reviewed. Risk factors for depression reviewed and negative. Hearing function and visual acuity are intact. ADLs screened and addressed as needed. Functional ability and level of safety reviewed and appropriate. Education, counseling and referrals performed based on assessed risks today. Patient provided with a copy of personalized plan for preventive services. - He will get shingles vaccination at pharmacy  - He is doing amazing with weight loss! Keep it up  -  Follow up in one year or sooner if needed  2. Herpes simplex virus (HSV) infection  - acyclovir  (ZOVIRAX ) 200 MG capsule; Take 1 capsule (200 mg total) by mouth 2 (two) times daily.  Dispense: 180 capsule; Refill: 3  3. Benign prostatic hyperplasia with incomplete bladder emptying  - PSA; Future - tamsulosin  (FLOMAX ) 0.4 MG CAPS capsule; Take 1 capsule (0.4 mg total) by mouth daily.  Dispense: 90 capsule; Refill: 3 - PSA  4. Prediabetes - Consider metformin  - Lipid panel; Future - TSH; Future - CBC; Future - Comprehensive metabolic panel with GFR; Future - Hemoglobin A1c; Future - Hemoglobin A1c - Comprehensive metabolic panel with GFR - CBC - TSH - Lipid panel  5. Orbital swelling  - Ambulatory referral to Optometry  6. Obesity, class 2  - Lipid panel; Future - TSH; Future - CBC; Future - Comprehensive metabolic panel with GFR; Future - Hemoglobin A1c - Comprehensive metabolic panel with GFR - CBC - TSH - Lipid panel  7. BMI 39.0-39.9,adult  - Lipid panel; Future - TSH; Future - CBC; Future - Comprehensive metabolic panel with GFR; Future - Hemoglobin A1c - Comprehensive metabolic panel with GFR - CBC - TSH - Lipid panel  Alto Atta, NP
